# Patient Record
Sex: Male | Born: 1975
Health system: Southern US, Community
[De-identification: ages and names within clinical notes are randomized; demographics above are authoritative.]

## PROBLEM LIST (undated history)

## (undated) DIAGNOSIS — Q909 Down syndrome, unspecified: Secondary | ICD-10-CM

## (undated) DIAGNOSIS — R7303 Prediabetes: Secondary | ICD-10-CM

## (undated) DIAGNOSIS — K219 Gastro-esophageal reflux disease without esophagitis: Secondary | ICD-10-CM

## (undated) DIAGNOSIS — Z9889 Other specified postprocedural states: Secondary | ICD-10-CM

## (undated) DIAGNOSIS — K760 Fatty (change of) liver, not elsewhere classified: Secondary | ICD-10-CM

## (undated) DIAGNOSIS — M1990 Unspecified osteoarthritis, unspecified site: Secondary | ICD-10-CM

## (undated) HISTORY — PX: TONSILLECTOMY AND ADENOIDECTOMY: SHX28

## (undated) HISTORY — DX: Fatty (change of) liver, not elsewhere classified: K76.0

## (undated) HISTORY — PX: HERNIA REPAIR: SHX51

## (undated) HISTORY — PX: CARDIAC SURGERY: SHX584

---
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
## 2009-11-28 ENCOUNTER — Encounter: Admission: RE | Admit: 2009-11-28 | Discharge: 2009-12-02 | Payer: Self-pay | Admitting: Family Medicine

## 2010-02-11 ENCOUNTER — Emergency Department (HOSPITAL_COMMUNITY): Admission: EM | Admit: 2010-02-11 | Discharge: 2010-02-11 | Payer: Self-pay | Admitting: Emergency Medicine

## 2010-03-06 ENCOUNTER — Ambulatory Visit (HOSPITAL_COMMUNITY): Admission: RE | Admit: 2010-03-06 | Discharge: 2010-03-07 | Payer: Self-pay | Admitting: Surgery

## 2010-03-28 HISTORY — PX: LAPAROSCOPIC ASSISTED VENTRAL HERNIA REPAIR: SHX6312

## 2012-03-27 ENCOUNTER — Emergency Department (HOSPITAL_COMMUNITY): Payer: Medicare Other

## 2012-03-27 ENCOUNTER — Emergency Department (HOSPITAL_COMMUNITY)
Admission: EM | Admit: 2012-03-27 | Discharge: 2012-03-27 | Disposition: A | Discharge status: Home / Self Care | Payer: Medicare Other | Attending: Emergency Medicine | Admitting: Emergency Medicine

## 2012-03-27 ENCOUNTER — Encounter (HOSPITAL_COMMUNITY): Payer: Self-pay | Admitting: Emergency Medicine

## 2012-03-27 DIAGNOSIS — R109 Unspecified abdominal pain: Secondary | ICD-10-CM | POA: Insufficient documentation

## 2012-03-27 DIAGNOSIS — Q909 Down syndrome, unspecified: Secondary | ICD-10-CM | POA: Insufficient documentation

## 2012-03-27 HISTORY — DX: Down syndrome, unspecified: Q90.9

## 2012-03-27 LAB — CBC WITH DIFFERENTIAL/PLATELET
Eosinophils Absolute: 0.1 10*3/uL (ref 0.0–0.7)
Eosinophils Relative: 1 % (ref 0–5)
HCT: 49 % (ref 39.0–52.0)
Hemoglobin: 16.9 g/dL (ref 13.0–17.0)
Lymphocytes Relative: 31 % (ref 12–46)
Lymphs Abs: 2.2 10*3/uL (ref 0.7–4.0)
MCH: 31.8 pg (ref 26.0–34.0)
MCV: 92.3 fL (ref 78.0–100.0)
Monocytes Absolute: 0.5 10*3/uL (ref 0.1–1.0)
Monocytes Relative: 7 % (ref 3–12)
RBC: 5.31 MIL/uL (ref 4.22–5.81)

## 2012-03-27 LAB — URINALYSIS, ROUTINE W REFLEX MICROSCOPIC
Bilirubin Urine: NEGATIVE
Ketones, ur: NEGATIVE mg/dL
Leukocytes, UA: NEGATIVE
Nitrite: NEGATIVE
Specific Gravity, Urine: 1.026 (ref 1.005–1.030)
Urobilinogen, UA: 0.2 mg/dL (ref 0.0–1.0)
pH: 5 (ref 5.0–8.0)

## 2012-03-27 LAB — COMPREHENSIVE METABOLIC PANEL
BUN: 14 mg/dL (ref 6–23)
CO2: 26 mEq/L (ref 19–32)
Calcium: 9.1 mg/dL (ref 8.4–10.5)
GFR calc Af Amer: >90 mL/min (ref >90)
GFR calc non Af Amer: >90 mL/min (ref >90)
Glucose, Bld: 99 mg/dL (ref 70–99)
Total Protein: 7 g/dL (ref 6.0–8.3)

## 2012-03-27 MED ORDER — FENTANYL CITRATE 0.05 MG/ML IJ SOLN
50.0000 ug | Freq: Once | INTRAMUSCULAR | Status: AC
  Administered 2012-03-27: 50 ug via INTRAVENOUS
  Filled 2012-03-27: qty 2

## 2012-03-27 MED ORDER — HYDROCODONE-ACETAMINOPHEN 5-325 MG PO TABS: 2.0000 | ORAL_TABLET | ORAL | Status: DC | PRN

## 2012-03-27 MED ORDER — SODIUM CHLORIDE 0.9 % IV BOLUS (SEPSIS)
1000.0000 mL | Freq: Once | INTRAVENOUS | Status: AC
  Administered 2012-03-27: 1000 mL via INTRAVENOUS

## 2012-03-27 NOTE — ED Notes (Signed)
Patient transported to CT 

## 2012-03-27 NOTE — ED Provider Notes (Signed)
Medical screening examination/treatment/procedure(s) were performed by non-physician practitioner and as supervising physician I was immediately available for consultation/collaboration.   Shelda Jakes, MD 03/27/12 1359

## 2012-03-27 NOTE — ED Notes (Signed)
Pt woke last p.m. C/o right flank pain, denies nausea, difficulty voiding or blood in urine. Pt is restless, unable to sit or stand or get comfortable in any way.

## 2012-03-27 NOTE — ED Provider Notes (Signed)
History     CSN: [id number]  Arrival date & time 03/27/12  7829   First MD Initiated Contact with Patient 03/27/12 1010      Chief Complaint  Patient presents with  . Flank Pain    (Consider location/radiation/quality/duration/timing/severity/associated sxs/prior treatment) HPI Comments: Pt c/o right flank pain:denies history of kidney stone:no known injury  Patient is a 36 y.o. male presenting with flank pain. The history is provided by the patient and a parent. No language interpreter was used.  Flank Pain This is a new problem. The current episode started yesterday. The problem occurs intermittently. The problem has been unchanged. Pertinent negatives include no abdominal pain, coughing, fever, nausea or vomiting. Exacerbated by: pt states that he can't get comfortable in any position. He has tried NSAIDs for the symptoms. The treatment provided mild relief.    Past Medical History  Diagnosis Date  . Down's syndrome     Past Surgical History  Procedure Date  . Hernia repair   . Cardiac surgery     av canal repair, mitral valve replacemt    No family history on file.  History  Substance Use Topics  . Smoking status: Never Smoker   . Smokeless tobacco: Never Used  . Alcohol Use: No      Review of Systems  Constitutional: Negative for fever.  Respiratory: Negative for cough.   Cardiovascular: Negative.   Gastrointestinal: Negative for nausea, vomiting and abdominal pain.  Genitourinary: Positive for flank pain.  Skin: Negative.     Allergies  Review of patient's allergies indicates no known allergies.  Home Medications   Current Outpatient Rx  Name  Route  Sig  Dispense  Refill  . ALLOPURINOL 100 MG PO TABS   Oral   Take 100 mg by mouth daily.         . IBUPROFEN 200 MG PO TABS   Oral   Take 400 mg by mouth every 6 (six) hours as needed. For pain           BP 124/71  Pulse 77  Temp 97.3 F (36.3 C) (Oral)  Resp 14  SpO2 100%  Physical  Exam  Nursing note and vitals reviewed. Constitutional: He is oriented to person, place, and time. He appears well-developed and well-nourished.  HENT:       Down facies  Neck: Neck supple.  Cardiovascular: Normal rate and regular rhythm.   Pulmonary/Chest: Effort normal and breath sounds normal.  Abdominal: Soft. Bowel sounds are normal. There is no tenderness.  Musculoskeletal: Normal range of motion.       Flank non tender to palpation  Neurological: He is alert and oriented to person, place, and time.  Skin: Skin is warm and dry.  Psychiatric: He has a normal mood and affect.    ED Course  Procedures (including critical care time)   Labs Reviewed  CBC WITH DIFFERENTIAL  COMPREHENSIVE METABOLIC PANEL  URINALYSIS, ROUTINE W REFLEX MICROSCOPIC   Ct Abdomen Pelvis Wo Contrast  03/27/2012  *RADIOLOGY REPORT*  Clinical Data: New onset of right flank pain.  CT ABDOMEN AND PELVIS WITHOUT CONTRAST  Technique:  Multidetector CT imaging of the abdomen and pelvis was performed following the standard protocol without intravenous contrast.  Comparison: CT of the abdomen pelvis 02/11/2010.  Findings:  Lung Bases: Minimal scattered subsegmental atelectasis throughout the visualized lung bases bilaterally.  High attenuation material in the region of the interventricular and interatrial septae immediately inferior to the aortic root is likely postoperative.  Abdomen/Pelvis:  There are no calcifications within the collecting system of either kidney, along the course of either ureter, or within the lumen of the urinary bladder to suggest urinary tract calculi at this time.  No hydroureteronephrosis or perinephric stranding to suggest urinary tract obstruction.  Mild diffuse decreased attenuation throughout the hepatic parenchyma, compatible with hepatic steatosis.  No focal hepatic lesions are identified.  Enhanced appearance of the gallbladder, spleen and bilateral adrenal glands is unremarkable. There  are numerous appendicoliths within the appendix.  However, the appendix is normal in caliber and appearance.  No ascites or pneumoperitoneum and no pathologic distension of small bowel.  No definite pathologic lymphadenopathy identified within the abdomen or pelvis.  Small ventral hernia containing only some omental fat. There is some mild stranding in the omental fat within the hernia, which could suggest some edema or infarction of the fat.  No entrapment of the bowel at this time.  Prostate urinary bladder are unremarkable in appearance.  Musculoskeletal: There are no aggressive appearing lytic or blastic lesions noted in the visualized portions of the skeleton. Sternotomy wires.  IMPRESSION: 1.  Small ventral hernias containing a small amount of omental fat. There is some mild stranding in the fat within the hernia, which could suggest some edema or infarction of the fat.  No evidence of bowel entrapment at this time. 2.  Numerous appendicoliths.  Despite the presence of appendicoliths, there are no findings to suggest acute appendicitis at this time. 3.  No abnormal urinary tract calculi or findings of urinary tract obstruction. 4.  Hepatic steatosis. 5.  Additional incidental findings, as above.   Original Report Authenticated By: Trudie Reed, M.D.      1. Flank pain       MDM  Discussed findings with father:pt is comfortable at this time:will send home with something for pain:discussed with father the possibility of shingles         Teressa Lower, NP 03/27/12 1353

## 2012-03-27 NOTE — ED Notes (Signed)
Returned from CT.

## 2012-03-27 NOTE — ED Notes (Addendum)
Reports and points to right upper lateral rib area described as a cramp rated 8/19. Onset @ 1200am regardless of position, denies dysuria, no hematuria, no nausea, no vomiting.

## 2012-07-12 ENCOUNTER — Encounter: Payer: Self-pay | Admitting: Internal Medicine

## 2012-07-25 ENCOUNTER — Encounter: Payer: Self-pay | Admitting: Internal Medicine

## 2012-07-28 ENCOUNTER — Encounter: Payer: Self-pay | Admitting: Internal Medicine

## 2012-07-28 ENCOUNTER — Ambulatory Visit (INDEPENDENT_AMBULATORY_CARE_PROVIDER_SITE_OTHER): Payer: Medicare Other | Admitting: Internal Medicine

## 2012-07-28 VITALS — BP 90/70 | HR 64 | Ht 60.0 in | Wt 177.8 lb

## 2012-07-28 DIAGNOSIS — R1011 Right upper quadrant pain: Secondary | ICD-10-CM

## 2012-07-28 DIAGNOSIS — M109 Gout, unspecified: Secondary | ICD-10-CM | POA: Insufficient documentation

## 2012-07-28 DIAGNOSIS — Q909 Down syndrome, unspecified: Secondary | ICD-10-CM | POA: Insufficient documentation

## 2012-07-28 NOTE — Patient Instructions (Addendum)
You have been schedule for a HIDA scan at [facility] on 08/05/2012 @ 12:30pm.  Please arrive 15 minutes prior to your appointment.  Have nothing to eat or drink 6 hours prior to your appointment.  If you need to cancel or reschedule please call [phone]

## 2012-07-28 NOTE — Progress Notes (Signed)
Patient ID: Thomas Salinas, male   DOB: [birth year], 37 y.o.   MRN: [id number]  SUBJECTIVE: HPI Thomas Salinas, Thomas Salinas, is a 37 year old male with Down syndrome who is seen in consultation at the request of Dr. Kathrynn Running for evaluation of right flank pain. Patient is accompanied today by his father. The patient and his father described 2 discrete episodes of right sided abdominal pain in the midaxillary line over the last 3-4 months. Both episodes have started somewhat acutely and lasted for 12-36 hours. The patient reports no change in the pain with movement. The patient does not recall the association with eating, but his mother states that food seems to make it worse. It also has started both times in the middle of the night. They deny associated nausea or vomiting. There is some mild anorexia around the time of the pain. No associated with bowel movement no change in bowel habits including no rectal bleeding or melena. Fevers or chills. No urinary symptoms including no hematuria or dysuria. He was seen and evaluated each time and he is now had both an abdominal CT scan and abdominal ultrasound. Lab testing and urinalysis were unrevealing. He is not having any pain today and has not had a recurrent episode in the last 3 weeks. 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  He very very infrequently uses ibuprofen  Review of Systems  As per history of present illness, otherwise negative   Past Medical History  Diagnosis Date  . Down's syndrome   . Fatty liver     Current Outpatient Prescriptions  Medication Sig Dispense Refill  . allopurinol (ZYLOPRIM) 100 MG tablet Take 100 mg by mouth daily.      Marland Kitchen ibuprofen (ADVIL,MOTRIN) 200 MG tablet Take 400 mg by mouth every 6 (six) hours as needed. For pain       No current facility-administered medications for this visit.    No Known Allergies  Family History  Problem Relation Age of Onset  . Heart disease Father     History   Substance Use Topics  . Smoking status: Never Smoker   . Smokeless tobacco: Never Used  . Alcohol Use: No    OBJECTIVE: BP 90/70  Pulse 64  Ht 5' (1.524 m)  Wt 177 lb 12.8 oz (80.65 kg)  BMI 34.72 kg/m2 Constitutional: Pleasant male in no acute distress, classic appearance for trisomy 35 HEENT: Normocephalic and atraumatic. Oropharynx is clear and moist. No oropharyngeal exudate. Conjunctivae are normal. No scleral icterus. Neck: Neck supple. Trachea midline. Cardiovascular: Normal rate, regular rhythm and intact distal pulses. There is no pain over palpation of the rib cage Pulmonary/chest: Effort normal and breath sounds normal. No wheezing, rales or rhonchi. Abdominal: Soft, nontender, nondistended. Bowel sounds active throughout. There are no masses palpable. No hepatosplenomegaly. Extremities: no clubbing, cyanosis, or edema Lymphadenopathy: No cervical adenopathy noted. Neurological: Alert and oriented to person place and time. Skin: Skin is warm and dry. No rashes noted. Psychiatric: Normal mood and affect. Behavior is normal.  Labs and Imaging -- CBC    Component Value Date/Time   WBC 7.1 03/27/2012 0951   RBC 5.31 03/27/2012 0951   HGB 16.9 03/27/2012 0951   HCT 49.0 03/27/2012 0951   PLT 177 03/27/2012 0951   MCV 92.3 03/27/2012 0951   MCH 31.8 03/27/2012 0951   MCHC 34.5 03/27/2012 0951   RDW 13.3 03/27/2012 0951   LYMPHSABS 2.2 03/27/2012 0951   MONOABS 0.5 03/27/2012 0951  EOSABS 0.1 03/27/2012 0951   BASOSABS 0.0 03/27/2012 0951    CMP     Component Value Date/Time   NA 136 03/27/2012 0951   K 4.4 03/27/2012 0951   CL 100 03/27/2012 0951   CO2 26 03/27/2012 0951   GLUCOSE 99 03/27/2012 0951   BUN 14 03/27/2012 0951   CREATININE 0.87 03/27/2012 0951   CALCIUM 9.1 03/27/2012 0951   PROT 7.0 03/27/2012 0951   ALBUMIN 3.9 03/27/2012 0951   AST 32 03/27/2012 0951   ALT 53 03/27/2012 0951   ALKPHOS 45 03/27/2012 0951   BILITOT 0.5 03/27/2012 0951   GFRNONAA >90 03/27/2012  0951   GFRAA >90 03/27/2012 0951    CT ABDOMEN AND PELVIS WITHOUT CONTRAST - 03/27/2012   Technique:  Multidetector CT imaging of the abdomen and pelvis was performed following the standard protocol without intravenous contrast.   Comparison: CT of the abdomen pelvis 02/11/2010.   Findings:   Lung Bases: Minimal scattered subsegmental atelectasis throughout the visualized lung bases bilaterally.  High attenuation material in the region of the interventricular and interatrial septae immediately inferior to the aortic root is likely postoperative.   Abdomen/Pelvis:  There are no calcifications within the collecting system of either kidney, along the course of either ureter, or within the lumen of the urinary bladder to suggest urinary tract calculi at this time.  No hydroureteronephrosis or perinephric stranding to suggest urinary tract obstruction.   Mild diffuse decreased attenuation throughout the hepatic parenchyma, compatible with hepatic steatosis.  No focal hepatic lesions are identified.  Enhanced appearance of the gallbladder, spleen and bilateral adrenal glands is unremarkable. There are numerous appendicoliths within the appendix.  However, the appendix is normal in caliber and appearance.  No ascites or pneumoperitoneum and no pathologic distension of small bowel.  No definite pathologic lymphadenopathy identified within the abdomen or pelvis.  Small ventral hernia containing only some omental fat. There is some mild stranding in the omental fat within the hernia, which could suggest some edema or infarction of the fat.  No entrapment of the bowel at this time.  Prostate urinary bladder are unremarkable in appearance.   Musculoskeletal: There are no aggressive appearing lytic or blastic lesions noted in the visualized portions of the skeleton. Sternotomy wires.   IMPRESSION: 1.  Small ventral hernias containing a small amount of omental fat. There is some mild  stranding in the fat within the hernia, which could suggest some edema or infarction of the fat.  No evidence of bowel entrapment at this time. 2.  Numerous appendicoliths.  Despite the presence of appendicoliths, there are no findings to suggest acute appendicitis at this time. 3.  No abnormal urinary tract calculi or findings of urinary tract obstruction. 4.  Hepatic steatosis. 5.  Additional incidental findings, as above.  Ultrasound-abdominal Limited, 07/05/2012 Impression fatty liver. Partially visualized pancreas is unremarkable. Normal gallbladder. Common bile duct measures 0.3 cm diameter, normal. Hepatopedal blood flow in the portal vein by color duplex. Normal right kidney 10.2 cm in length.  Urinalysis, same day, negative for blood, nitrite or leukocyte esterase WBC 9.0, hemoglobin 16.4, platelet count 207 CMP normal except for mild elevation in BUN at 24 glucose 105  ASSESSMENT AND PLAN:  37 year old male with Down syndrome who is seen in consultation at the request of Dr. Kathrynn Running for evaluation of right flank pain.   1.  Right-sided abd vs flank pain -- the differential includes biliary colic/dyskinesia, right-sided renal stone, musculoskeletal pain, and less likely  PUD.  Given that the attacks are very discrete and occur only for 12-24 hours, but musculoskeletal pain and gastritis/peptic ulcer disease are felt less likely.  Prior imaging both of the gallbladder and right kidney have been unremarkable with no evidence for stones. Also of note there was no blood in his urine.  Certainly he could have biliary dyskinesia, and I have requested a HIDA scan to evaluate for this. He is feeling well now, and I think endoscopy is low yield for providing a diagnosis. He is instructed to call us back should the pain return. Both he and his father state understanding. Should his biliary imaging be abnormal, we would consider referral for cholecystectomy.

## 2012-08-05 ENCOUNTER — Encounter (HOSPITAL_COMMUNITY)
Admission: RE | Admit: 2012-08-05 | Discharge: 2012-08-05 | Disposition: A | Discharge status: Home / Self Care | Payer: Medicare Other | Source: Ambulatory Visit | Attending: Internal Medicine | Admitting: Internal Medicine

## 2012-08-05 DIAGNOSIS — R1011 Right upper quadrant pain: Secondary | ICD-10-CM

## 2012-08-05 MED ORDER — TECHNETIUM TC 99M MEBROFENIN IV KIT
5.3000 | PACK | Freq: Once | INTRAVENOUS | Status: AC | PRN
  Administered 2012-08-05: 5 via INTRAVENOUS

## 2012-09-06 ENCOUNTER — Other Ambulatory Visit (INDEPENDENT_AMBULATORY_CARE_PROVIDER_SITE_OTHER): Payer: Self-pay | Admitting: General Surgery

## 2012-09-06 ENCOUNTER — Ambulatory Visit (INDEPENDENT_AMBULATORY_CARE_PROVIDER_SITE_OTHER): Payer: Medicaid Other | Admitting: General Surgery

## 2012-09-06 ENCOUNTER — Encounter (INDEPENDENT_AMBULATORY_CARE_PROVIDER_SITE_OTHER): Payer: Self-pay | Admitting: General Surgery

## 2012-09-06 VITALS — BP 130/74 | HR 80 | Temp 98.0°F | Resp 18 | Ht 61.0 in | Wt 178.0 lb

## 2012-09-06 DIAGNOSIS — K828 Other specified diseases of gallbladder: Secondary | ICD-10-CM

## 2012-09-06 NOTE — Progress Notes (Signed)
Patient ID: Thomas Salinas, male   DOB: [birth year], 37 y.o.   MRN: [id number]  Chief Complaint  Patient presents with  . Establish Care    G.B    HPI Thomas Salinas is a 37 y.o. male.  The patient is a 37 year old male with Down syndrome who is referred by Dr. Rhea Belton for evaluation of biliary dyskinesia. The patient began having right-sided flank/right upper quadrant pain 4 months ago. This is accompanied with nausea and vomiting after meals. The patient does have a high fatty diet which causes pain.  HIDA scan reveals an ejection fraction of 28. CT scan and ultrasound revealed no stones. HPI  Past Medical History  Diagnosis Date  . Down's syndrome   . Fatty liver     Past Surgical History  Procedure Laterality Date  . Hernia repair    . Cardiac surgery      av canal repair, mitral valve replacemt  . Myringotomy  1989    left ear  . Eye surgery  1981    strabismus    Family History  Problem Relation Age of Onset  . Heart disease Father   . Hypertension Mother     Social History History  Substance Use Topics  . Smoking status: Never Smoker   . Smokeless tobacco: Never Used  . Alcohol Use: No    No Known Allergies  Current Outpatient Prescriptions  Medication Sig Dispense Refill  . allopurinol (ZYLOPRIM) 100 MG tablet Take 100 mg by mouth daily.      Marland Kitchen ibuprofen (ADVIL,MOTRIN) 200 MG tablet Take 400 mg by mouth every 6 (six) hours as needed. For pain       No current facility-administered medications for this visit.    Review of Systems Review of Systems  Constitutional: Negative.   Eyes: Negative.   Respiratory: Negative.   Cardiovascular: Negative.   Gastrointestinal: Negative.   Neurological: Negative.     Blood pressure 130/74, pulse 80, temperature 98 F (36.7 C), resp. rate 18, height 5\' 1"  (1.549 m), weight 178 lb (80.74 kg).  Physical Exam Physical Exam  Constitutional: He is oriented to person, place, and time. He appears well-developed and  well-nourished.  HENT:  Head: Normocephalic and atraumatic.  Eyes: Conjunctivae and EOM are normal. Pupils are equal, round, and reactive to light.  Neck: Neck supple.  Cardiovascular: Normal rate, regular rhythm and normal heart sounds.   Pulmonary/Chest: Effort normal and breath sounds normal.  Abdominal: Soft. Bowel sounds are normal. There is tenderness (ruq).  Musculoskeletal: Normal range of motion.  Neurological: He is alert and oriented to person, place, and time.    Data Reviewed Reviewed as above  Assessment    36 year old male with biliary dyskinesia     Plan    1. We'll proceed to the operating room for a laparoscopic cholecystectomy without any cholangiogram.  2.All risks and benefits were discussed with the patient to generally include: infection, bleeding, possible need for post op ERCP, damage to the bile ducts, and bile leak. Alternatives were offered and described.  All questions were answered and the patient voiced understanding of the procedure and wishes to proceed at this point with a laparoscopic cholecystectomy         Marigene Ehlers., Dorann Davidson 09/06/2012, 10:02 AM

## 2012-09-07 ENCOUNTER — Observation Stay (HOSPITAL_COMMUNITY)
Admission: AD | Admit: 2012-09-07 | Discharge: 2012-09-08 | Disposition: A | Discharge status: Home / Self Care | Payer: Medicare Other | Source: Ambulatory Visit | Attending: General Surgery | Admitting: General Surgery

## 2012-09-07 ENCOUNTER — Telehealth (INDEPENDENT_AMBULATORY_CARE_PROVIDER_SITE_OTHER): Payer: Self-pay | Admitting: General Surgery

## 2012-09-07 DIAGNOSIS — Z862 Personal history of diseases of the blood and blood-forming organs and certain disorders involving the immune mechanism: Secondary | ICD-10-CM | POA: Insufficient documentation

## 2012-09-07 DIAGNOSIS — Z79899 Other long term (current) drug therapy: Secondary | ICD-10-CM | POA: Insufficient documentation

## 2012-09-07 DIAGNOSIS — Q909 Down syndrome, unspecified: Secondary | ICD-10-CM | POA: Insufficient documentation

## 2012-09-07 DIAGNOSIS — R112 Nausea with vomiting, unspecified: Secondary | ICD-10-CM | POA: Insufficient documentation

## 2012-09-07 DIAGNOSIS — R1011 Right upper quadrant pain: Secondary | ICD-10-CM | POA: Insufficient documentation

## 2012-09-07 DIAGNOSIS — Z8639 Personal history of other endocrine, nutritional and metabolic disease: Secondary | ICD-10-CM | POA: Insufficient documentation

## 2012-09-07 DIAGNOSIS — K8020 Calculus of gallbladder without cholecystitis without obstruction: Principal | ICD-10-CM | POA: Insufficient documentation

## 2012-09-07 LAB — COMPREHENSIVE METABOLIC PANEL
CO2: 23 mEq/L (ref 19–32)
Calcium: 7.8 mg/dL — ABNORMAL LOW (ref 8.4–10.5)
Creatinine, Ser: 0.77 mg/dL (ref 0.50–1.35)
GFR calc Af Amer: >90 mL/min (ref >90)
GFR calc non Af Amer: >90 mL/min (ref >90)
Glucose, Bld: 86 mg/dL (ref 70–99)

## 2012-09-07 LAB — CBC
Hemoglobin: 16.4 g/dL (ref 13.0–17.0)
MCHC: 36.4 g/dL — ABNORMAL HIGH (ref 30.0–36.0)
RBC: 5.01 MIL/uL (ref 4.22–5.81)

## 2012-09-07 MED ORDER — CEFAZOLIN SODIUM-DEXTROSE 2-3 GM-% IV SOLR: 2.0000 g | INTRAVENOUS | Status: DC

## 2012-09-07 MED ORDER — HYDROCODONE-ACETAMINOPHEN 5-325 MG PO TABS
1.0000 | ORAL_TABLET | ORAL | Status: DC | PRN
  Administered 2012-09-08: 1 via ORAL
  Filled 2012-09-07: qty 1

## 2012-09-07 MED ORDER — ENOXAPARIN SODIUM 40 MG/0.4ML ~~LOC~~ SOLN
40.0000 mg | SUBCUTANEOUS | Status: DC
  Filled 2012-09-07: qty 0.4

## 2012-09-07 MED ORDER — ACETAMINOPHEN 650 MG RE SUPP: 650.0000 mg | Freq: Four times a day (QID) | RECTAL | Status: DC | PRN

## 2012-09-07 MED ORDER — ONDANSETRON HCL 4 MG/2ML IJ SOLN: 4.0000 mg | Freq: Four times a day (QID) | INTRAMUSCULAR | Status: DC | PRN

## 2012-09-07 MED ORDER — KCL IN DEXTROSE-NACL 20-5-0.45 MEQ/L-%-% IV SOLN
INTRAVENOUS | Status: DC
  Administered 2012-09-07: via INTRAVENOUS
  Filled 2012-09-07 (×2): qty 1000

## 2012-09-07 MED ORDER — ACETAMINOPHEN 325 MG PO TABS: 650.0000 mg | ORAL_TABLET | Freq: Four times a day (QID) | ORAL | Status: DC | PRN

## 2012-09-07 MED ORDER — DOCUSATE SODIUM 100 MG PO CAPS
100.0000 mg | ORAL_CAPSULE | Freq: Two times a day (BID) | ORAL | Status: DC
  Administered 2012-09-08: 100 mg via ORAL
  Filled 2012-09-07 (×3): qty 1

## 2012-09-07 MED ORDER — MORPHINE SULFATE 2 MG/ML IJ SOLN: 2.0000 mg | INTRAMUSCULAR | Status: DC | PRN

## 2012-09-07 MED ORDER — CHLORHEXIDINE GLUCONATE 4 % EX LIQD
1.0000 "application " | Freq: Once | CUTANEOUS | Status: DC
  Filled 2012-09-07: qty 15

## 2012-09-07 NOTE — H&P (Signed)
No chief complaint on file.   HISTORY:  Thomas Salinas is a 37 y.o. male with Down Syndrome who presents to the hospital with worsening RUQ pain.  He was recently seen by Dr Derrell Lolling for biliary colic.  He was scheduled for surgery in early May, but is unable to tolerate any PO without significant pain.   The patient began having right-sided flank/right upper quadrant pain 4 months ago. This is accompanied with nausea and vomiting after meals. The patient eats a high fat diet which causes pain. HIDA scan reveals an ejection fraction of 28%. CT scan and ultrasound revealed no stones.   Past Medical History  Diagnosis Date  . Down's syndrome   . Fatty liver        Past Surgical History  Procedure Laterality Date  . Hernia repair    . Cardiac surgery      av canal repair, mitral valve replacemt  . Myringotomy  1989    left ear  . Eye surgery  1981    strabismus      Current Facility-Administered Medications  Medication Dose Route Frequency Provider Last Rate Last Dose  . acetaminophen (TYLENOL) tablet 650 mg  650 mg Oral Q6H PRN Romie Levee, MD       Or  . acetaminophen (TYLENOL) suppository 650 mg  650 mg Rectal Q6H PRN Romie Levee, MD      . dextrose 5 % and 0.45 % NaCl with KCl 20 mEq/L infusion   Intravenous Continuous Romie Levee, MD      . docusate sodium (COLACE) capsule 100 mg  100 mg Oral BID Romie Levee, MD      . Melene Muller ON 09/08/2012] enoxaparin (LOVENOX) injection 40 mg  40 mg Subcutaneous Q24H Romie Levee, MD      . HYDROcodone-acetaminophen (NORCO/VICODIN) 5-325 MG per tablet 1-2 tablet  1-2 tablet Oral Q4H PRN Romie Levee, MD      . morphine 2 MG/ML injection 2 mg  2 mg Intravenous Q3H PRN Romie Levee, MD      . ondansetron [facility]) injection 4 mg  4 mg Intravenous Q6H PRN Romie Levee, MD         No Known Allergies    Family History  Problem Relation Age of Onset  . Heart disease Father   . Hypertension Mother       History   Social History  .  Marital Status: Single    Spouse Name: N/A    Number of Children: N/A  . Years of Education: N/A   Social History Main Topics  . Smoking status: Never Smoker   . Smokeless tobacco: Never Used  . Alcohol Use: No  . Drug Use: No  . Sexually Active: Not on file   Other Topics Concern  . Not on file   Social History Narrative  . No narrative on file       REVIEW OF SYSTEMS - PERTINENT POSITIVES ONLY: Review of Systems - General ROS: negative for - chills or fever Respiratory ROS: no cough, shortness of breath, or wheezing Cardiovascular ROS: no chest pain or dyspnea on exertion Gastrointestinal ROS: positive for - abdominal pain, diarrhea and nausea/vomiting negative for - blood in stools or melena  EXAM:  General appearance: alert and cooperative Resp: clear to auscultation bilaterally Cardio: regular rate and rhythm GI: soft, nondistended, tenderness to palpation in RUQ   LABORATORY RESULTS: Available labs are reviewed  Lab Results  Component Value Date   ALT 53 03/27/2012  AST 32 03/27/2012   ALKPHOS 45 03/27/2012   BILITOT 0.5 03/27/2012   Repeat LFTs pending  RADIOLOGY RESULTS:   Images and reports are reviewed. [place]: normal gallbladder, CBD 3mm, no stones HIDA: Gallbladder ejection fraction after ingestion of Ensure was 28.8%  ASSESSMENT AND PLAN: Thomas Salinas is a 37 y.o. M with Down Syndrome that has biliary colic.  He is unable to tolerate a diet at home and his parents feel that he will not be able to continue until his scheduled apt for cholecystectomy in May.  I will admit him to the [facility],MD service and make him NPO.  I will get some labs tonight and discuss possible cholecystectomy tomorrow with Dr Daphine Deutscher in the AM.    Vanita Panda, MD Colon and Rectal Surgery / General Surgery [facility]

## 2012-09-07 NOTE — Telephone Encounter (Signed)
pts father called stating increasing abd pain and inability to tolerate po.  Scheduled for surgery in early may but they do not think he will be able to wait until then.  i have arranged for a direct admit to [facility].  We will draw labs and possibly do the surgery tom.

## 2012-09-08 ENCOUNTER — Encounter (HOSPITAL_COMMUNITY): Payer: Self-pay | Admitting: General Practice

## 2012-09-08 DIAGNOSIS — R112 Nausea with vomiting, unspecified: Secondary | ICD-10-CM

## 2012-09-08 LAB — SURGICAL PCR SCREEN
MRSA, PCR: NEGATIVE
Staphylococcus aureus: POSITIVE — AB

## 2012-09-08 MED ORDER — HYDROCODONE-ACETAMINOPHEN 5-325 MG PO TABS: 1.0000 | ORAL_TABLET | ORAL | Status: DC | PRN

## 2012-09-08 NOTE — Discharge Summary (Signed)
Physician Discharge Summary  Patient ID: Thomas Salinas MRN: [id number] DOB/AGE: 35-11-1975 35 y.o.  Admit date: 09/07/2012 Discharge date: 09/08/2012  Admitting Diagnosis: Nausea, Vomiting, Diarrhea RUQ abdominal pain  Discharge Diagnosis Patient Active Problem List   Diagnosis Date Noted  . Down syndrome 07/28/2012  . Gout 07/28/2012    Consultants None  Imaging: No results found.  Procedures None  Hospital Course:  37 y.o. male with Down Syndrome who presents to the hospital with worsening RUQ pain, nausea, vomiting, and diarrhea. He was recently seen by Dr Derrell Lolling for biliary dyskinesia. He was scheduled for surgery in early May, but is unable to tolerate any PO without significant pain.  The patient began having right-sided flank/right upper quadrant pain 4 months ago, worse over last 4 days. This is accompanied with nausea and vomiting after meals. The patient eats a high fat diet which causes pain. HIDA scan reveals an ejection fraction of 28%. CT scan and ultrasound revealed no stones.  Patient was admitted for pain control and to monitor his nausea, vomiting, and diarrhea.  It was thought he likely has gastroenteritis as well as his gallbladder problems.  On HD #2 his symptoms started to resolve and he was started on a diet.  This  was advanced as tolerated.  On HD #2, the patient was voiding well, tolerating diet, ambulating well, pain well controlled, vital signs stable, and felt stable for discharge home.  Dr. Derrell Lolling and the [facility] office will be in contact with the family to arrange OP surgery on Monday or Tuesday.        Medication List    TAKE these medications       HYDROcodone-acetaminophen 5-325 MG per tablet  Commonly known as:  NORCO/VICODIN  Take 1-2 tablets by mouth every 4 (four) hours as needed.     naproxen sodium 220 MG tablet  Commonly known as:  ANAPROX  Take 220 mg by mouth 2 (two) times daily as needed (for pain).             Follow-up  Information   Call Lajean Saver, MD.   Contact information:   [address] [place] [address] [phone]       Signed: [facility] [phone]  09/08/2012, 9:50 AM

## 2012-09-08 NOTE — Progress Notes (Signed)
  Subjective: Pt feeling about the same, subjectively pain is about the same, but not much pain on exam.  Pt no longer nauseated and vomiting.  Pt still NPO.  Pt ambulating to the bathroom.  Father at bedside would really like his son to have the surgery while he's here.    Objective: Vital signs in last 24 hours: Temp:  [97.6 F (36.4 C)-98.1 F (36.7 C)] 98 F (36.7 C) (04/17 0551) Pulse Rate:  [73-89] 73 (04/17 0551) Resp:  [16-18] 18 (04/17 0551) BP: (99-105)/(63-67) 100/67 mmHg (04/17 0551) SpO2:  [96 %] 96 % (04/17 0551) Weight:  [178 lb (80.74 kg)] 178 lb (80.74 kg) (04/17 0100)    Intake/Output from previous day: 04/16 0701 - 04/17 0700 In: 487.5 [I.V.:487.5] Out: -  Intake/Output this shift:    PE: Gen:  Alert, NAD, pleasant Abd: Soft, minimal tenderness, ND, +BS, no HSM   Lab Results:   Recent Labs  09/07/12 2240  WBC 4.8  HGB 16.4  HCT 45.1  PLT 167   BMET  Recent Labs  09/07/12 2240  NA 137  K 3.6  CL 103  CO2 23  GLUCOSE 86  BUN 13  CREATININE 0.77  CALCIUM 7.8*   PT/INR No results found for this basename: LABPROT, INR,  in the last 72 hours CMP     Component Value Date/Time   NA 137 09/07/2012 2240   K 3.6 09/07/2012 2240   CL 103 09/07/2012 2240   CO2 23 09/07/2012 2240   GLUCOSE 86 09/07/2012 2240   BUN 13 09/07/2012 2240   CREATININE 0.77 09/07/2012 2240   CALCIUM 7.8* 09/07/2012 2240   PROT 6.4 09/07/2012 2240   ALBUMIN 3.2* 09/07/2012 2240   AST 55* 09/07/2012 2240   ALT 75* 09/07/2012 2240   ALKPHOS 44 09/07/2012 2240   BILITOT 0.5 09/07/2012 2240   GFRNONAA >90 09/07/2012 2240   GFRAA >90 09/07/2012 2240   Lipase  No results found for this basename: lipase       Studies/Results: No results found.  Anti-infectives: Anti-infectives   Start     Dose/Rate Route Frequency Ordered Stop   09/08/12 0600  ceFAZolin (ANCEF) IVPB 2 g/50 mL premix     2 g 100 mL/hr over 30 Minutes Intravenous On call to O.R. 09/07/12 2258 09/09/12  0559       Assessment/Plan RUQ abdominal pain, biliary colic Now presenting primarily due to nausea/vomiting, abdominal pain very minimal 1.  IVF, antibiotics, pain control 2.  Advance diet to clears then soft diet at lunch 3.  Plan to send him home and set him up for surgery on Monday or Tuesday next week 4.  Probably d/c today  Down syndrome H/o Gout    LOS: 1 day    Aris Georgia 09/08/2012, 7:22 AM Pager: [phone]

## 2012-09-08 NOTE — Care Management Note (Signed)
    Page 1 of 1   09/08/2012     11:53:53 AM   CARE MANAGEMENT NOTE 09/08/2012  Patient:  Thomas Salinas, Thomas Salinas   Account Number:  [id number]  Date Initiated:  09/08/2012  Documentation initiated by:  Lorenda Ishihara  Subjective/Objective Assessment:   37 yo male admitted with abd pain, plan for cholecystectomy. PTA lived at home with parents.     Action/Plan:   Home when stable   Anticipated DC Date:  09/08/2012   Anticipated DC Plan:  HOME/SELF CARE      DC Planning Services  CM consult      Choice offered to / List presented to:             Status of service:  Completed, signed off Medicare Important Message given?   (If response is "NO", the following Medicare IM given date fields will be blank) Date Medicare IM given:   Date Additional Medicare IM given:    Discharge Disposition:  HOME/SELF CARE  Per UR Regulation:  Reviewed for med. necessity/level of care/duration of stay  If discussed at Long Length of Stay Meetings, dates discussed:    Comments:

## 2012-09-12 ENCOUNTER — Encounter (HOSPITAL_COMMUNITY): Payer: Self-pay | Admitting: Certified Registered"

## 2012-09-12 ENCOUNTER — Ambulatory Visit (HOSPITAL_COMMUNITY): Payer: Medicare Other | Admitting: Certified Registered"

## 2012-09-12 ENCOUNTER — Other Ambulatory Visit (INDEPENDENT_AMBULATORY_CARE_PROVIDER_SITE_OTHER): Payer: Self-pay | Admitting: General Surgery

## 2012-09-12 ENCOUNTER — Encounter (HOSPITAL_COMMUNITY)
Admission: RE | Disposition: A | Discharge status: Home / Self Care | Payer: Self-pay | Source: Ambulatory Visit | Attending: General Surgery

## 2012-09-12 ENCOUNTER — Encounter (HOSPITAL_COMMUNITY): Payer: Self-pay | Admitting: *Deleted

## 2012-09-12 ENCOUNTER — Observation Stay (HOSPITAL_COMMUNITY)
Admission: RE | Admit: 2012-09-12 | Discharge: 2012-09-13 | Disposition: A | Discharge status: Home / Self Care | Payer: Medicare Other | Source: Ambulatory Visit | Attending: General Surgery | Admitting: General Surgery

## 2012-09-12 ENCOUNTER — Encounter (INDEPENDENT_AMBULATORY_CARE_PROVIDER_SITE_OTHER): Payer: Self-pay | Admitting: General Surgery

## 2012-09-12 DIAGNOSIS — K811 Chronic cholecystitis: Principal | ICD-10-CM | POA: Insufficient documentation

## 2012-09-12 DIAGNOSIS — K7689 Other specified diseases of liver: Secondary | ICD-10-CM | POA: Insufficient documentation

## 2012-09-12 DIAGNOSIS — Z79899 Other long term (current) drug therapy: Secondary | ICD-10-CM | POA: Insufficient documentation

## 2012-09-12 DIAGNOSIS — Z9049 Acquired absence of other specified parts of digestive tract: Secondary | ICD-10-CM

## 2012-09-12 DIAGNOSIS — Q909 Down syndrome, unspecified: Secondary | ICD-10-CM | POA: Insufficient documentation

## 2012-09-12 HISTORY — PX: CHOLECYSTECTOMY: SHX55

## 2012-09-12 SURGERY — LAPAROSCOPIC CHOLECYSTECTOMY
Anesthesia: General | Wound class: Clean

## 2012-09-12 MED ORDER — MUPIROCIN 2 % EX OINT
TOPICAL_OINTMENT | CUTANEOUS | Status: AC
  Administered 2012-09-12: 13:00:00
  Filled 2012-09-12: qty 22

## 2012-09-12 MED ORDER — SUCCINYLCHOLINE CHLORIDE 20 MG/ML IJ SOLN
INTRAMUSCULAR | Status: DC | PRN
  Administered 2012-09-12: 100 mg via INTRAVENOUS

## 2012-09-12 MED ORDER — LACTATED RINGERS IV SOLN: INTRAVENOUS | Status: DC

## 2012-09-12 MED ORDER — BUPIVACAINE-EPINEPHRINE (PF) 0.5% -1:200000 IJ SOLN
INTRAMUSCULAR | Status: AC
  Filled 2012-09-12: qty 10

## 2012-09-12 MED ORDER — LIDOCAINE HCL (PF) 2 % IJ SOLN
INTRAMUSCULAR | Status: DC | PRN
  Administered 2012-09-12: 20 mg

## 2012-09-12 MED ORDER — HYDROMORPHONE HCL PF 1 MG/ML IJ SOLN: 0.5000 mg | INTRAMUSCULAR | Status: DC | PRN

## 2012-09-12 MED ORDER — CEFAZOLIN SODIUM-DEXTROSE 2-3 GM-% IV SOLR
2.0000 g | INTRAVENOUS | Status: AC
  Administered 2012-09-12: 2 g via INTRAVENOUS

## 2012-09-12 MED ORDER — MIDAZOLAM HCL 5 MG/5ML IJ SOLN
INTRAMUSCULAR | Status: DC | PRN
  Administered 2012-09-12: 2 mg via INTRAVENOUS

## 2012-09-12 MED ORDER — ONDANSETRON HCL 4 MG/2ML IJ SOLN: 4.0000 mg | Freq: Four times a day (QID) | INTRAMUSCULAR | Status: DC | PRN

## 2012-09-12 MED ORDER — LACTATED RINGERS IV SOLN
INTRAVENOUS | Status: DC | PRN
  Administered 2012-09-12: 14:00:00 via INTRAVENOUS

## 2012-09-12 MED ORDER — FENTANYL CITRATE 0.05 MG/ML IJ SOLN: 25.0000 ug | INTRAMUSCULAR | Status: DC | PRN

## 2012-09-12 MED ORDER — EPHEDRINE SULFATE 50 MG/ML IJ SOLN
INTRAMUSCULAR | Status: DC | PRN
  Administered 2012-09-12 (×2): 10 mg via INTRAVENOUS

## 2012-09-12 MED ORDER — OXYCODONE-ACETAMINOPHEN 5-325 MG PO TABS
1.0000 | ORAL_TABLET | ORAL | Status: DC | PRN
  Administered 2012-09-12 (×2): 1 via ORAL
  Administered 2012-09-13 (×2): 2 via ORAL
  Filled 2012-09-12: qty 2
  Filled 2012-09-12: qty 1
  Filled 2012-09-12: qty 2
  Filled 2012-09-12: qty 1

## 2012-09-12 MED ORDER — BUPIVACAINE-EPINEPHRINE 0.5% -1:200000 IJ SOLN
INTRAMUSCULAR | Status: DC | PRN
  Administered 2012-09-12: 10 mL

## 2012-09-12 MED ORDER — ALLOPURINOL 100 MG PO TABS
100.0000 mg | ORAL_TABLET | Freq: Every day | ORAL | Status: DC
  Administered 2012-09-12 – 2012-09-13 (×2): 100 mg via ORAL
  Filled 2012-09-12 (×2): qty 1

## 2012-09-12 MED ORDER — LACTATED RINGERS IR SOLN
Status: DC | PRN
  Administered 2012-09-12: 1000 mL

## 2012-09-12 MED ORDER — ONDANSETRON HCL 4 MG PO TABS: 4.0000 mg | ORAL_TABLET | Freq: Four times a day (QID) | ORAL | Status: DC | PRN

## 2012-09-12 MED ORDER — PROPOFOL 10 MG/ML IV BOLUS
INTRAVENOUS | Status: DC | PRN
  Administered 2012-09-12: 150 mg via INTRAVENOUS

## 2012-09-12 MED ORDER — ONDANSETRON HCL 4 MG/2ML IJ SOLN
INTRAMUSCULAR | Status: DC | PRN
  Administered 2012-09-12: 4 mg via INTRAVENOUS

## 2012-09-12 MED ORDER — CEFAZOLIN SODIUM-DEXTROSE 2-3 GM-% IV SOLR
INTRAVENOUS | Status: AC
  Filled 2012-09-12: qty 50

## 2012-09-12 MED ORDER — SODIUM CHLORIDE 0.9 % IV SOLN
INTRAVENOUS | Status: DC
  Administered 2012-09-12: 16:00:00 via INTRAVENOUS

## 2012-09-12 MED ORDER — DEXAMETHASONE SODIUM PHOSPHATE 10 MG/ML IJ SOLN
INTRAMUSCULAR | Status: DC | PRN
  Administered 2012-09-12: 10 mg via INTRAVENOUS

## 2012-09-12 MED ORDER — ROCURONIUM BROMIDE 100 MG/10ML IV SOLN
INTRAVENOUS | Status: DC | PRN
  Administered 2012-09-12: 10 mg via INTRAVENOUS
  Administered 2012-09-12: 30 mg via INTRAVENOUS

## 2012-09-12 MED ORDER — NEOSTIGMINE METHYLSULFATE 1 MG/ML IJ SOLN
INTRAMUSCULAR | Status: DC | PRN
  Administered 2012-09-12: 3 mg via INTRAVENOUS

## 2012-09-12 MED ORDER — FENTANYL CITRATE 0.05 MG/ML IJ SOLN
INTRAMUSCULAR | Status: DC | PRN
  Administered 2012-09-12: 50 ug via INTRAVENOUS
  Administered 2012-09-12: 100 ug via INTRAVENOUS
  Administered 2012-09-12 (×2): 50 ug via INTRAVENOUS

## 2012-09-12 MED ORDER — GLYCOPYRROLATE 0.2 MG/ML IJ SOLN
INTRAMUSCULAR | Status: DC | PRN
  Administered 2012-09-12: 0.4 mg via INTRAVENOUS

## 2012-09-12 SURGICAL SUPPLY — 36 items
APPLIER CLIP 5 13 M/L LIGAMAX5 (MISCELLANEOUS) ×2
BENZOIN TINCTURE PRP APPL 2/3 (GAUZE/BANDAGES/DRESSINGS) ×2 IMPLANT
CABLE HIGH FREQUENCY MONO STRZ (ELECTRODE) IMPLANT
CANISTER SUCTION 2500CC (MISCELLANEOUS) IMPLANT
CHLORAPREP W/TINT 26ML (MISCELLANEOUS) ×2 IMPLANT
CLIP APPLIE 5 13 M/L LIGAMAX5 (MISCELLANEOUS) ×1 IMPLANT
CLOTH BEACON ORANGE TIMEOUT ST (SAFETY) ×2 IMPLANT
COVER MAYO STAND STRL (DRAPES) IMPLANT
DECANTER SPIKE VIAL GLASS SM (MISCELLANEOUS) IMPLANT
DEVICE TROCAR PUNCTURE CLOSURE (ENDOMECHANICALS) ×2 IMPLANT
DRAPE C-ARM 42X72 X-RAY (DRAPES) IMPLANT
DRAPE LAPAROSCOPIC ABDOMINAL (DRAPES) ×2 IMPLANT
ELECT REM PT RETURN 9FT ADLT (ELECTROSURGICAL) ×2
ELECTRODE REM PT RTRN 9FT ADLT (ELECTROSURGICAL) ×1 IMPLANT
GAUZE SPONGE 2X2 8PLY STRL LF (GAUZE/BANDAGES/DRESSINGS) ×1 IMPLANT
GLOVE BIO SURGEON STRL SZ7.5 (GLOVE) ×22 IMPLANT
GOWN STRL NON-REIN LRG LVL3 (GOWN DISPOSABLE) ×4 IMPLANT
GOWN STRL REIN XL XLG (GOWN DISPOSABLE) ×4 IMPLANT
HEMOSTAT SURGICEL 4X8 (HEMOSTASIS) IMPLANT
KIT BASIN OR (CUSTOM PROCEDURE TRAY) ×2 IMPLANT
NEEDLE INSUFFLATION 14GA 120MM (NEEDLE) ×2 IMPLANT
NS IRRIG 1000ML POUR BTL (IV SOLUTION) ×2 IMPLANT
POUCH SPECIMEN RETRIEVAL 10MM (ENDOMECHANICALS) IMPLANT
SCISSORS LAP 5X35 DISP (ENDOMECHANICALS) ×2 IMPLANT
SET CHOLANGIOGRAPH MIX (MISCELLANEOUS) IMPLANT
SET IRRIG TUBING LAPAROSCOPIC (IRRIGATION / IRRIGATOR) ×2 IMPLANT
SOLUTION ANTI FOG 6CC (MISCELLANEOUS) ×2 IMPLANT
SPONGE GAUZE 2X2 STER 10/PKG (GAUZE/BANDAGES/DRESSINGS) ×1
SPONGE GAUZE 4X4 12PLY (GAUZE/BANDAGES/DRESSINGS) IMPLANT
STRIP CLOSURE SKIN 1/2X4 (GAUZE/BANDAGES/DRESSINGS) ×2 IMPLANT
SUT MNCRL AB 4-0 PS2 18 (SUTURE) ×2 IMPLANT
TOWEL OR 17X26 10 PK STRL BLUE (TOWEL DISPOSABLE) ×2 IMPLANT
TRAY LAP CHOLE (CUSTOM PROCEDURE TRAY) ×2 IMPLANT
TROCAR BLADELESS OPT 5 75 (ENDOMECHANICALS) ×6 IMPLANT
TROCAR XCEL NON-BLD 11X100MML (ENDOMECHANICALS) ×2 IMPLANT
TUBING INSUFFLATION 10FT LAP (TUBING) ×2 IMPLANT

## 2012-09-12 NOTE — Anesthesia Preprocedure Evaluation (Addendum)
Anesthesia Evaluation  Patient identified by MRN, date of birth, ID band Patient awake    Reviewed: Allergy & Precautions, H&P , NPO status , Patient's Chart, lab work & pertinent test results  Airway Mallampati: III TM Distance: >3 FB Neck ROM: full    Dental no notable dental hx. (+) Dental Advisory Given, Poor Dentition, Chipped and Missing All teeth broken off/ worn down:   Pulmonary neg pulmonary ROS,  breath sounds clear to auscultation  Pulmonary exam normal       Cardiovascular Exercise Tolerance: Good negative cardio ROS  Rhythm:regular Rate:Normal     Neuro/Psych Down's syndrome negative neurological ROS  negative psych ROS   GI/Hepatic negative GI ROS, Neg liver ROS,   Endo/Other  negative endocrine ROS  Renal/GU negative Renal ROS  negative genitourinary   Musculoskeletal   Abdominal   Peds  Hematology negative hematology ROS (+)   Anesthesia Other Findings   Reproductive/Obstetrics negative OB ROS                          Anesthesia Physical Anesthesia Plan  ASA: II  Anesthesia Plan: General   Post-op Pain Management:    Induction: Intravenous  Airway Management Planned: Oral ETT  Additional Equipment:   Intra-op Plan:   Post-operative Plan: Extubation in OR  Informed Consent: I have reviewed the patients History and Physical, chart, labs and discussed the procedure including the risks, benefits and alternatives for the proposed anesthesia with the patient or authorized representative who has indicated his/her understanding and acceptance.   Dental Advisory Given  Plan Discussed with: CRNA and Surgeon  Anesthesia Plan Comments:        Anesthesia Quick Evaluation

## 2012-09-12 NOTE — Preoperative (Signed)
Beta Blockers   Reason not to administer Beta Blockers:Not Applicable 

## 2012-09-12 NOTE — Progress Notes (Signed)
Pt c/o pain on left eyelid.  Vision check completed- bil pupils 2mm, round and reactive.  No swelling noted to left eyelid.  Or called to request anesthesia to visit pt.  Pt's mother states pt had conjunctivitis several months ago and occasionally c/o left eye hurting.  Pt continues to state it's not his left eye but left eyelid. Pt sat on side of bed and vomited 50cc of bile green stomach contents.  After vomiting, dsg to umbilicus noted to be saturated w/ bright red blood.  I removed dsg and observed blood oozing from umbilical surgical site.  Applied pressure until bleeding stopped and redressed umbilical site.  Other surgical sites remained clean, dry and intact.

## 2012-09-12 NOTE — Interval H&P Note (Signed)
History and Physical Interval Note:  09/12/2012 12:51 PM  Thomas Salinas  has presented today for surgery, with the diagnosis of gallbladder  The various methods of treatment have been discussed with the patient and family. After consideration of risks, benefits and other options for treatment, the patient has consented to  Procedure(s): LAPAROSCOPIC CHOLECYSTECTOMY (N/A) as a surgical intervention .  The patient's history has been reviewed, patient examined, no change in status, stable for surgery.  I have reviewed the patient's chart and labs.  Questions were answered to the patient's satisfaction.     Marigene Ehlers., Jed Limerick

## 2012-09-12 NOTE — Op Note (Signed)
Pre Operative Diagnosis: biliary dyskenesia  Post Operative Diagnosis: same  Surgeon: Dr. Axel Filler   Procedure: lap chole  Assistant: none  Anesthesia: Gen. Endotracheal anesthesia   EBL: 5cc3  Complications:  Counts: reported as correct x 2   Findings: The patient had large amount of omental adhesions to his previous umb hernia repair area.   Indications for procedure: Pt is a 37 y/o M with RUQ pain.  Pt underwent HIDA scan which revealed an EF of 28%.  He was counseled along with his father in clinic and he decided to have his gallbladder electively removed.  Details of the procedure:  The patient was taken to the operating and placed in the supine position with bilateral SCDs in place. A time out was called and all facts were verified. A pneumoperitoneum was obtained via A Veress needle technique to a pressure of 14mm of mercury. A 5mm trochar was then placed in the right upper quadrant under visualization, and there were no injuries to any abdominal organs.  There was a large amount of abdominal wall omental adhesions.  These were taken down bluntly once a 5mm RLQ port was placed.  An area inferior to his umbilicus was cleared away.   A 11 mm port was then placed in the umbilical region after infiltrating with local anesthesia under direct visualization. A  third epigastric port was  placed under direct visualization. The gallbladder was identified and retracted, the peritoneum was then sharply dissected from the gallbladder and this dissection was carried down to Calot's triangle. The gallbladder was identified and stripped away circumferentially and seen going into the gallbladder 360.  The critical angle was obtained.   2 clips were placed distally and one proximally and the cystic duct transected. The cystic artery was identified and 2 clips  Placed distally and one proximally  and transected.  We then proceeded to remove the gallbladder off the hepatic fossa with Bovie  cautery. A latex retrieval bag was then placed in the abdomen and gallbladder placed in the bag. The hepatic fossa was then reexamined and hemostasis was achieved with Bovie cautery and was excellent at the end of the case. The subhepatic fossa and perihepatic fossa was then irrigated until the effluent was clear. The 11 mm trocar fascia was reapproximated with the Endo Close #1 Vicryl x1.  The pneumoperitoneum was evacuated and all trochars removed under direct visulalization.  The skin was then closed with 4-0 Monocryl and the skin dressed with Steri-Strips, gauze, and tape.  The patient was awaken from general anesthesia and taken to the recovery room in stable condition.

## 2012-09-12 NOTE — Anesthesia Postprocedure Evaluation (Signed)
Anesthesia Post Note  Patient: Thomas Salinas  Procedure(s) Performed: Procedure(s) (LRB): LAPAROSCOPIC CHOLECYSTECTOMY (N/A)  Anesthesia type: General  Patient location: PACU  Post pain: Pain level controlled  Post assessment: Post-op Vital signs reviewed  Last Vitals: BP 120/59  Pulse 91  Temp(Src) 36.3 C (Oral)  Resp 18  Ht 5\' 1"  (1.549 m)  Wt 176 lb 6.4 oz (80.015 kg)  BMI 33.35 kg/m2  SpO2 96%  Post vital signs: Reviewed  Level of consciousness: sedated  Complications: No apparent anesthesia complications. Pt was complaining of pain on his left upper eyelid. Pt states on external side. No obvious skin tears or laceration. I explained it is likely irritation from the eye tape used in the OR. Will provide antibiotic ointment.

## 2012-09-12 NOTE — H&P (View-Only) (Signed)
Patient ID: Thomas Salinas, male   DOB: 12/30/1975, 37 y.o.   MRN: [id number]  Chief Complaint  Patient presents with  . Establish Care    G.B    HPI [name] is a 37 y.o. male.  The patient is a 37-year-old male with Down syndrome who is referred by Dr. Pyrtle for evaluation of biliary dyskinesia. The patient began having right-sided flank/right upper quadrant pain 4 months ago. This is accompanied with nausea and vomiting after meals. The patient does have a high fatty diet which causes pain.  HIDA scan reveals an ejection fraction of 28. CT scan and ultrasound revealed no stones. HPI  Past Medical History  Diagnosis Date  . Down's syndrome   . Fatty liver     Past Surgical History  Procedure Laterality Date  . Hernia repair    . Cardiac surgery      av canal repair, mitral valve replacemt  . Myringotomy  1989    left ear  . Eye surgery  1981    strabismus    Family History  Problem Relation Age of Onset  . Heart disease Father   . Hypertension Mother     Social History History  Substance Use Topics  . Smoking status: Never Smoker   . Smokeless tobacco: Never Used  . Alcohol Use: No    No Known Allergies  Current Outpatient Prescriptions  Medication Sig Dispense Refill  . allopurinol (ZYLOPRIM) 100 MG tablet Take 100 mg by mouth daily.      . ibuprofen (ADVIL,MOTRIN) 200 MG tablet Take 400 mg by mouth every 6 (six) hours as needed. For pain       No current facility-administered medications for this visit.    Review of Systems Review of Systems  Constitutional: Negative.   Eyes: Negative.   Respiratory: Negative.   Cardiovascular: Negative.   Gastrointestinal: Negative.   Neurological: Negative.     Blood pressure 130/74, pulse 80, temperature 98 F (36.7 C), resp. rate 18, height 5' 1" (1.549 m), weight 178 lb (80.74 kg).  Physical Exam Physical Exam  Constitutional: He is oriented to person, place, and time. He appears well-developed and  well-nourished.  HENT:  Head: Normocephalic and atraumatic.  Eyes: Conjunctivae and EOM are normal. Pupils are equal, round, and reactive to light.  Neck: Neck supple.  Cardiovascular: Normal rate, regular rhythm and normal heart sounds.   Pulmonary/Chest: Effort normal and breath sounds normal.  Abdominal: Soft. Bowel sounds are normal. There is tenderness (ruq).  Musculoskeletal: Normal range of motion.  Neurological: He is alert and oriented to person, place, and time.    Data Reviewed Reviewed as above  Assessment    37-year-old male with biliary dyskinesia     Plan    1. We'll proceed to the operating room for a laparoscopic cholecystectomy without any cholangiogram.  2.All risks and benefits were discussed with the patient to generally include: infection, bleeding, possible need for post op ERCP, damage to the bile ducts, and bile leak. Alternatives were offered and described.  All questions were answered and the patient voiced understanding of the procedure and wishes to proceed at this point with a laparoscopic cholecystectomy         Anisha Starliper Jr., Endre Coutts 09/06/2012, 10:02 AM    

## 2012-09-12 NOTE — Transfer of Care (Signed)
Immediate Anesthesia Transfer of Care Note  Patient: Thomas Salinas  Procedure(s) Performed: Procedure(s): LAPAROSCOPIC CHOLECYSTECTOMY (N/A)  Patient Location: PACU  Anesthesia Type:General  Level of Consciousness: awake, alert  and oriented  Airway & Oxygen Therapy: Patient Spontanous Breathing and Patient connected to face mask oxygen  Post-op Assessment: Report given to PACU RN and Post -op Vital signs reviewed and stable  Post vital signs: Reviewed and stable  Complications: No apparent anesthesia complications

## 2012-09-13 ENCOUNTER — Encounter (HOSPITAL_COMMUNITY): Payer: Self-pay | Admitting: General Surgery

## 2012-09-13 MED ORDER — OXYCODONE-ACETAMINOPHEN 5-325 MG PO TABS: 1.0000 | ORAL_TABLET | ORAL | Status: DC | PRN

## 2012-09-13 NOTE — Discharge Summary (Signed)
Physician Discharge Summary  Patient ID: Thomas Salinas MRN: [id number] DOB/AGE: November 02, 1975 36 y.o.  Admit date: 09/12/2012 Discharge date: 09/13/2012  Admission Diagnoses:s/p Lap chole  Discharge Diagnoses: s/p lap chole Active Problems:   * No active hospital problems. *   Discharged Condition: good  Hospital Course: Pt had a LAp chole on 4.21, please see full op note for details. Pt was admitted and observed for pain c ontrol.  He had good pain control on PO pain meds, and was tolerating a regular diet.  He was ambulating and afebrile.  HE was deemed stable for DC and DC'd home.  Consults: None  Significant Diagnostic Studies: none  Treatments: surgery: s/p lap chole 4/21  Discharge Exam: Blood pressure 103/64, pulse 96, temperature 97.5 F (36.4 C), temperature source Oral, resp. rate 19, height 5\' 1"  (1.549 m), weight 176 lb 6.4 oz (80.015 kg), SpO2 96.00%. General appearance: alert and cooperative GI: soft, non-tender; bowel sounds normal; no masses,  no organomegaly, wounds c/d/i  Disposition: 01-Home or Self Care  Discharge Orders   Future Orders Complete By Expires     Discharge patient  As directed         Medication List    TAKE these medications       allopurinol 100 MG tablet  Commonly known as:  ZYLOPRIM  Take 100 mg by mouth daily.     HYDROcodone-acetaminophen 5-325 MG per tablet  Commonly known as:  NORCO/VICODIN  Take 1-2 tablets by mouth every 4 (four) hours as needed.     naproxen sodium 220 MG tablet  Commonly known as:  ANAPROX  Take 220 mg by mouth 2 (two) times daily as needed (for pain).     oxyCODONE-acetaminophen 5-325 MG per tablet  Commonly known as:  ROXICET  Take 1 tablet by mouth every 4 (four) hours as needed for pain.           Follow-up Information   Follow up with Lajean Saver, MD. Schedule an appointment as soon as possible for a visit in 2 weeks.   Contact information:   [address] [place]  [address] [phone]       Signed: Marigene Ehlers., Jed Limerick 09/13/2012, 8:56 AM

## 2012-09-13 NOTE — Care Management Note (Signed)
    Page 1 of 1   09/13/2012     10:47:03 AM   CARE MANAGEMENT NOTE 09/13/2012  Patient:  Thomas Salinas, Thomas Salinas   Account Number:  [id number]  Date Initiated:  09/13/2012  Documentation initiated by:  Lorenda Ishihara  Subjective/Objective Assessment:   37 yo male admitted s/p lap chole.     Action/Plan:   Home when stable   Anticipated DC Date:  09/13/2012   Anticipated DC Plan:  HOME/SELF CARE      DC Planning Services  CM consult      Choice offered to / List presented to:             Status of service:  Completed, signed off Medicare Important Message given?   (If response is "NO", the following Medicare IM given date fields will be blank) Date Medicare IM given:   Date Additional Medicare IM given:    Discharge Disposition:  HOME/SELF CARE  Per UR Regulation:  Reviewed for med. necessity/level of care/duration of stay  If discussed at Long Length of Stay Meetings, dates discussed:    Comments:

## 2012-09-28 ENCOUNTER — Encounter (INDEPENDENT_AMBULATORY_CARE_PROVIDER_SITE_OTHER): Payer: Self-pay | Admitting: General Surgery

## 2012-09-28 ENCOUNTER — Telehealth (INDEPENDENT_AMBULATORY_CARE_PROVIDER_SITE_OTHER): Payer: Self-pay

## 2012-09-28 ENCOUNTER — Ambulatory Visit (INDEPENDENT_AMBULATORY_CARE_PROVIDER_SITE_OTHER): Payer: Medicare Other | Admitting: General Surgery

## 2012-09-28 VITALS — BP 126/78 | HR 74 | Temp 97.8°F | Resp 18 | Ht 59.0 in | Wt 177.0 lb

## 2012-09-28 DIAGNOSIS — Z9889 Other specified postprocedural states: Secondary | ICD-10-CM

## 2012-09-28 DIAGNOSIS — Z9049 Acquired absence of other specified parts of digestive tract: Secondary | ICD-10-CM

## 2012-09-28 NOTE — Progress Notes (Signed)
Patient ID: Thomas Salinas, male   DOB: 1975-07-23, 37 y.o.   MRN: [id number] The patient is a 37 year old male status post laparoscopic cholecystectomy. Patient has been doing well postoperatively.  On exam: His wounds are clean dry and intact  Pathology: revealed chronic cholecystitis this was discussed with the patient.  Assessment and plan: The patient is a 37 year old male status post laparoscopic cholecystectomy. 1. Patient follow up when necessary.

## 2012-09-28 NOTE — Telephone Encounter (Addendum)
Called patient to see if he can come in for an earlier appt today (3:00 or 3:30 appt w/Dr. Derrell Lolling)  Will await a return call

## 2019-12-23 ENCOUNTER — Emergency Department (HOSPITAL_COMMUNITY): Payer: Medicare Other

## 2019-12-23 ENCOUNTER — Emergency Department (HOSPITAL_COMMUNITY)
Admission: EM | Admit: 2019-12-23 | Discharge: 2019-12-23 | Disposition: A | Discharge status: Home / Self Care | Payer: Medicare Other | Attending: Emergency Medicine | Admitting: Emergency Medicine

## 2019-12-23 ENCOUNTER — Other Ambulatory Visit: Payer: Self-pay

## 2019-12-23 DIAGNOSIS — Z20822 Contact with and (suspected) exposure to covid-19: Secondary | ICD-10-CM | POA: Diagnosis not present

## 2019-12-23 DIAGNOSIS — D72810 Lymphocytopenia: Secondary | ICD-10-CM | POA: Insufficient documentation

## 2019-12-23 DIAGNOSIS — R109 Unspecified abdominal pain: Secondary | ICD-10-CM | POA: Insufficient documentation

## 2019-12-23 DIAGNOSIS — D696 Thrombocytopenia, unspecified: Secondary | ICD-10-CM | POA: Insufficient documentation

## 2019-12-23 DIAGNOSIS — R52 Pain, unspecified: Secondary | ICD-10-CM

## 2019-12-23 LAB — CBC WITH DIFFERENTIAL/PLATELET
Abs Immature Granulocytes: 0.01 10*3/uL (ref 0.00–0.07)
Basophils Absolute: 0.1 10*3/uL (ref 0.0–0.1)
Basophils Relative: 4 %
Eosinophils Absolute: 0.2 10*3/uL (ref 0.0–0.5)
Eosinophils Relative: 7 %
HCT: 51.6 % (ref 39.0–52.0)
Hemoglobin: 17.6 g/dL — ABNORMAL HIGH (ref 13.0–17.0)
Immature Granulocytes: 0 %
Lymphocytes Relative: 5 %
Lymphs Abs: 0.1 10*3/uL — ABNORMAL LOW (ref 0.7–4.0)
MCH: 33.8 pg (ref 26.0–34.0)
MCHC: 34.1 g/dL (ref 30.0–36.0)
MCV: 99 fL (ref 80.0–100.0)
Monocytes Absolute: 0 10*3/uL — ABNORMAL LOW (ref 0.1–1.0)
Monocytes Relative: 2 %
Neutro Abs: 2.2 10*3/uL (ref 1.7–7.7)
Neutrophils Relative %: 82 %
Platelets: 68 10*3/uL — ABNORMAL LOW (ref 150–400)
RBC: 5.21 MIL/uL (ref 4.22–5.81)
RDW: 13.5 % (ref 11.5–15.5)
WBC: 2.7 10*3/uL — ABNORMAL LOW (ref 4.0–10.5)
nRBC: 19.5 % — ABNORMAL HIGH (ref 0.0–0.2)

## 2019-12-23 LAB — URINALYSIS, ROUTINE W REFLEX MICROSCOPIC
Bilirubin Urine: NEGATIVE
Glucose, UA: NEGATIVE mg/dL
Hgb urine dipstick: NEGATIVE
Ketones, ur: NEGATIVE mg/dL
Leukocytes,Ua: NEGATIVE
Nitrite: NEGATIVE
Protein, ur: NEGATIVE mg/dL
Specific Gravity, Urine: 1.023 (ref 1.005–1.030)
pH: 5 (ref 5.0–8.0)

## 2019-12-23 LAB — COMPREHENSIVE METABOLIC PANEL
ALT: 31 U/L (ref 0–44)
AST: 30 U/L (ref 15–41)
Albumin: 3.9 g/dL (ref 3.5–5.0)
Alkaline Phosphatase: 39 U/L (ref 38–126)
Anion gap: 13 (ref 5–15)
BUN: 18 mg/dL (ref 6–20)
CO2: 26 mmol/L (ref 22–32)
Calcium: 8.8 mg/dL — ABNORMAL LOW (ref 8.9–10.3)
Chloride: 103 mmol/L (ref 98–111)
Creatinine, Ser: 1 mg/dL (ref 0.61–1.24)
GFR calc Af Amer: >60 mL/min (ref >60)
GFR calc non Af Amer: >60 mL/min (ref >60)
Glucose, Bld: 95 mg/dL (ref 70–99)
Potassium: 4.7 mmol/L (ref 3.5–5.1)
Sodium: 142 mmol/L (ref 135–145)
Total Bilirubin: 0.6 mg/dL (ref 0.3–1.2)
Total Protein: 6.9 g/dL (ref 6.5–8.1)

## 2019-12-23 LAB — LIPASE, BLOOD: Lipase: 50 U/L (ref 11–51)

## 2019-12-23 LAB — SARS CORONAVIRUS 2 BY RT PCR (HOSPITAL ORDER, PERFORMED IN ~~LOC~~ HOSPITAL LAB): SARS Coronavirus 2: NEGATIVE

## 2019-12-23 MED ORDER — SODIUM CHLORIDE (PF) 0.9 % IJ SOLN
INTRAMUSCULAR | Status: AC
  Filled 2019-12-23: qty 50

## 2019-12-23 MED ORDER — IOHEXOL 300 MG/ML  SOLN
100.0000 mL | Freq: Once | INTRAMUSCULAR | Status: AC | PRN
  Administered 2019-12-23: 100 mL via INTRAVENOUS

## 2019-12-23 MED ORDER — SODIUM CHLORIDE 0.9 % IV BOLUS
500.0000 mL | Freq: Once | INTRAVENOUS | Status: AC
  Administered 2019-12-23: 500 mL via INTRAVENOUS

## 2019-12-23 NOTE — ED Notes (Signed)
Pt transported to CT ?

## 2019-12-23 NOTE — ED Triage Notes (Signed)
Patient reports left flank pain x 2 days. Pain rated 10/10.

## 2019-12-23 NOTE — ED Provider Notes (Signed)
[facility]-EMERGENCY DEPT Provider Note   CSN: [id number] Arrival date & time: 12/23/19  1001   History Chief Complaint  Patient presents with  . Flank Pain    Thomas Salinas is a 44 y.o. male with past medical history significant for Down syndrome, fatty liver disease, lap chole who presents for evaluation of left flank pain.  Pain began 2 days ago.  No recent injury or trauma.  Pain rated a 10/10 at worse, currently pain 1/10. Intermittent in nature. Radiates into left abdomen.  No testicular pain, swelling.  Denies fever, chills, nausea, vomiting, chest pain, shortness of breath, cough, hemoptysis, dysuria, hematuria.  Denies additional aggravating or alleviating factors. No rashes or lesions. Last BM this morning without melena or BRBPR.  History obtained from patient, father in room and past medical records. No interpretor was used.  HPI     Past Medical History:  Diagnosis Date  . Down's syndrome   . Fatty liver     Patient Active Problem List   Diagnosis Date Noted  . Down syndrome 07/28/2012  . Gout 07/28/2012    Past Surgical History:  Procedure Laterality Date  . CARDIAC SURGERY     av canal repair, mitral valve replacemt  . CHOLECYSTECTOMY N/A 09/12/2012   Procedure: LAPAROSCOPIC CHOLECYSTECTOMY;  Surgeon: Axel Filler, MD;  Location: [facility]S;  Service: General;  Laterality: N/A;  . EYE SURGERY  1981   strabismus  . HERNIA REPAIR    . MYRINGOTOMY  1989   left ear       Family History  Problem Relation Age of Onset  . Heart disease Father   . Hypertension Mother     Social History   Tobacco Use  . Smoking status: Never Smoker  . Smokeless tobacco: Never Used  Substance Use Topics  . Alcohol use: No  . Drug use: No    Home Medications Prior to Admission medications   Medication Sig Start Date End Date Taking? Authorizing Provider  allopurinol (ZYLOPRIM) 100 MG tablet Take 100 mg by mouth daily.   Yes [provider]  naproxen sodium (ANAPROX) 220 MG tablet Take 220 mg by mouth 2 (two) times daily as needed (for pain).   Yes [provider]  HYDROcodone-acetaminophen (NORCO/VICODIN) 5-325 MG per tablet Take 1-2 tablets by mouth every 4 (four) hours as needed. Patient not taking: Reported on 12/23/2019 09/08/12   Nonie Hoyer, PA-C  oxyCODONE-acetaminophen (ROXICET) 5-325 MG per tablet Take 1 tablet by mouth every 4 (four) hours as needed for pain. Patient not taking: Reported on 12/23/2019 09/13/12   Axel Filler, MD    Allergies    Patient has no known allergies.  Review of Systems   Review of Systems  Constitutional: Negative.   HENT: Negative.   Respiratory: Negative.   Cardiovascular: Negative.   Gastrointestinal: Positive for abdominal pain. Negative for abdominal distention, anal bleeding, blood in stool, constipation, diarrhea, nausea, rectal pain and vomiting.  Genitourinary: Positive for flank pain. Negative for decreased urine volume, difficulty urinating, discharge, dysuria, frequency, genital sores, hematuria, penile pain, penile swelling, scrotal swelling, testicular pain and urgency.  Skin: Negative.   Neurological: Negative.   All other systems reviewed and are negative.   Physical Exam Updated Vital Signs BP 114/71 (BP Location: Right Arm)   Pulse 66   Temp 98.8 F (37.1 C) (Oral)   Resp 18   Ht 5\' 1"  (1.549 m)   Wt 79.4 kg   SpO2 90%  BMI 33.07 kg/m   Physical Exam Vitals and nursing note reviewed.  Constitutional:      General: He is not in acute distress.    Appearance: He is well-developed. He is not ill-appearing, toxic-appearing or diaphoretic.  HENT:     Head: Normocephalic and atraumatic.     Nose: Nose normal.     Mouth/Throat:     Mouth: Mucous membranes are moist.  Eyes:     Pupils: Pupils are equal, round, and reactive to light.  Cardiovascular:     Rate and Rhythm: Normal rate and regular rhythm.     Pulses: Normal pulses.       Heart sounds: Normal heart sounds.  Pulmonary:     Effort: Pulmonary effort is normal. No respiratory distress.     Breath sounds: Normal breath sounds.     Comments: Clear to auscultation bilaterally. Abdominal:     General: Bowel sounds are normal. There is no distension.     Palpations: Abdomen is soft. There is no mass.     Tenderness: There is no abdominal tenderness. There is no right CVA tenderness, left CVA tenderness, guarding or rebound.     Hernia: No hernia is present.  Musculoskeletal:        General: No swelling, tenderness, deformity or signs of injury. Normal range of motion.     Cervical back: Normal range of motion and neck supple.     Right lower leg: No edema.     Left lower leg: No edema.     Comments: Moves all 4 extremities without difficulty. Compartments soft. Homans sign negative  Skin:    General: Skin is warm and dry.     Capillary Refill: Capillary refill takes 2 to 3 seconds.     Comments: No rashes or lesions  Neurological:     General: No focal deficit present.     Mental Status: He is alert and oriented to person, place, and time.    ED Results / Procedures / Treatments   Labs (all labs ordered are listed, but only abnormal results are displayed) Labs Reviewed  CBC WITH DIFFERENTIAL/PLATELET - Abnormal; Notable for the following components:      Result Value   WBC 2.7 (*)    Hemoglobin 17.6 (*)    Platelets 68 (*)    nRBC 19.5 (*)    Lymphs Abs 0.1 (*)    Monocytes Absolute 0.0 (*)    All other components within normal limits  COMPREHENSIVE METABOLIC PANEL - Abnormal; Notable for the following components:   Calcium 8.8 (*)    All other components within normal limits  URINE CULTURE  SARS CORONAVIRUS 2 BY RT PCR (HOSPITAL ORDER, PERFORMED IN [facility])  URINALYSIS, ROUTINE W REFLEX MICROSCOPIC  LIPASE, BLOOD    EKG None  Radiology CT ABDOMEN PELVIS W CONTRAST  Result Date: 12/23/2019 CLINICAL DATA:  Two day  history of severe LEFT flank pain (rated 10/10 by the patient). EXAM: CT ABDOMEN AND PELVIS WITH CONTRAST TECHNIQUE: Multidetector CT imaging of the abdomen and pelvis was performed using the standard protocol following bolus administration of intravenous contrast. CONTRAST:  100mL OMNIPAQUE IOHEXOL 300 MG/ML IV. COMPARISON:  Unenhanced CT abdomen and pelvis 03/27/2012. FINDINGS: Lower chest: Scattered very small peripheral ground-glass opacities involving the visualized lower lobes. Scarring in the RIGHT MIDDLE LOBE and lingula. Visualized lung bases otherwise clear. Hepatobiliary: Diffuse hepatic steatosis without focal hepatic parenchymal abnormalities. Relative enlargement of the LEFT lobe and caudate lobe when  compared to the RIGHT lobe. Surgically absent gallbladder. No biliary ductal dilation. Pancreas: Normal in appearance without evidence of mass, ductal dilation, or inflammation. Spleen: Normal in size. Approximate 1.2 cm solitary low-attenuation lesion in the posterior spleen just above the hilum, not conspicuous on the prior unenhanced study. Adrenals/Urinary Tract: Normal appearing adrenal glands. Kidneys normal in size and appearance without focal parenchymal abnormality. No hydronephrosis. No evidence of urinary tract calculi. Normal appearing urinary bladder. Stomach/Bowel: Stomach normal in appearance for the degree of distention. Normal-appearing small bowel. Entire colon relatively decompressed with expected stool burden. High attenuation material throughout the normal appearing appendix which may represent multiple appendicoliths. Vascular/Lymphatic: No visible aorto-iliofemoral atherosclerosis. Incompletely opacified or filling defect involving the portal vein. No pathologic lymphadenopathy. Reproductive: Prostate gland and seminal vesicles normal in size and appearance for age. Other: Umbilical hernia and midline supraumbilical abdominal wall hernia, both containing fat. RIGHT inguinal hernia  containing fat. Musculoskeletal: Degenerative disc disease and spondylosis with ANTERIOR osteophytes at L1-2 and L2-3. No acute findings. IMPRESSION: 1. Incompletely opacified portal vein or filling defect involving the portal vein. Doppler ultrasound evaluation of the portal vein is recommended to determine if there is thrombus or if the finding is related to mixing of unopacified blood. 2. Diffuse hepatic steatosis without focal hepatic parenchymal abnormalities. Morphologic findings of the liver which are highly suggestive of hepatic cirrhosis. 3. Approximate 1.2 cm solitary low-attenuation lesion in the posterior spleen, not conspicuous on the prior unenhanced study, likely a benign cyst or hemangioma. 4. Umbilical hernia and midline supraumbilical abdominal wall hernia, both containing fat. 5. RIGHT inguinal hernia containing fat. 6. Scattered very small peripheral ground-glass opacities involving the visualized lower lobes, likely inflammatory or postinflammatory. Electronically Signed   By: Hulan Saas M.D.   On: 12/23/2019 15:20    Procedures Procedures (including critical care time)  Medications Ordered in ED Medications  sodium chloride (PF) 0.9 % injection (has no administration in time range)  sodium chloride 0.9 % bolus 500 mL (500 mLs Intravenous New Bag/Given (Non-Interop) 12/23/19 1351)  iohexol (OMNIPAQUE) 300 MG/ML solution 100 mL (100 mLs Intravenous Contrast Given 12/23/19 1451)   ED Course  I have reviewed the triage vital signs and the nursing notes.  Pertinent labs & imaging results that were available during my care of the patient were reviewed by me and considered in my medical decision making (see chart for details).  44 year old with history of Down syndrome presents for evaluation of left flank and left abdominal pain.  Intermittent symptoms over the last 2 days.  Last bowel movement this morning without melena or bright red blood per rectum.  No upper respiratory  symptoms, cough, and unilateral leg swelling, redness or warmth.  He is without tachycardia, tachypnea or hypoxia.  His heart and lungs are clear.  I have low suspicion for atypical ACS, PE, dissection, pneumothorax, upper respiratory infection as cause of his symptoms.  He has no overlying skin changes.  I am not able to reproduce his symptoms on exam.  He has no urinary complaints.  GU exam without significant abnormality. No pain currently. Plan on labs, imaging and reassess.  Labs and imagine personally reviewed and interpreted:  UA negative for infection CBC leukopenia , low platelets 68, Smudge cells, plan on contacting hematology to determine significance. No active bleeding on exam CMP calcium 8.8 no additional electrolyte, renal or liver abnormalities Lipase 50 Urine culture pending CT AP with possible filling defect to portal vein. Recommend [place] to r/o occlusion.  Fatty liver possible cirrhosis, Spleen with possible cyst.  Bilateral lower lobe groundglass opacities.  Patient is not having any respiratory complaints.  Will obtain Covid test given leukopenia  Patient reassessed. Denies current pain. Discussed findings on CT and labs. Plan on [place].  Patient reassessed. Comfortable. Denies needing pain medications. Ambulatory in ED without difficulty. Apparently according to secretary [place] tech did not see order or US doppler for PV. Secretary made [place] aware and they will be over to perform.  Care transferred to [facility], PA-C who will follow up on imaging and labs and determine disposition. Plan on consult with Hematology for leukopenia, low platlets with smudge cells.      MDM Rules/Calculators/A&P                           Final Clinical Impression(s) / ED Diagnoses Final diagnoses:  Pain  Lymphopenia  Platelets decreased (HCC)  Left flank pain    Rx / DC Orders ED Discharge Orders    None       Tranika Scholler A, PA-C 12/23/19 1745    Cathren Laine, MD 12/24/19 [phone]

## 2019-12-23 NOTE — Discharge Instructions (Addendum)
A mass on the Spleen was found which radiology thought was a possible cyst -- Will need to be followed up outpatient with repeat imaging in 6 months.  You will receive a phone call from Dr. Mosetta Putt, hematology/oncology, about scheduling appointment for outpatient follow-up next week.  If your left-sided flank/lateral abdominal discomfort returns, please try managing with Tylenol or heating pads.  Return to the ED or seek immediate medical attention should experience any new or worsening symptoms.

## 2019-12-23 NOTE — ED Provider Notes (Signed)
Received patient as a handoff at shift change Britni Henderly, PA-C.  In short, patient is a 44 year old male with PMH of Down syndrome, fatty liver disease, and surgical history of cholecystectomy who presented to the ED with a 2-day history of atraumatic left-sided flank/abdominal discomfort.  While patient noted 10 out of 10 pain in triage, his pain was a 1 out of 10 during handoff provider's exam.  He has not required any analgesics or antiemetics while here in the ED.  He denies any fevers or chills, nausea or vomiting, chest pain or difficulty breathing, cough or hemoptysis, dysuria or hematuria, or changes in bowel habits.  Last BM was this morning and normal.  He is accompanied by his father who is at bedside.  Physical Exam  BP 100/65   Pulse 68   Temp 98.8 F (37.1 C) (Oral)   Resp 18   Ht 5\' 1"  (1.549 m)   Wt 79.4 kg   SpO2 94%   BMI 33.07 kg/m   Physical Exam Vitals and nursing note reviewed. Exam conducted with a chaperone present.  Constitutional:      Appearance: He is not ill-appearing.  HENT:     Head: Normocephalic and atraumatic.  Eyes:     General: No scleral icterus.    Conjunctiva/sclera: Conjunctivae normal.  Cardiovascular:     Rate and Rhythm: Normal rate and regular rhythm.     Pulses: Normal pulses.     Heart sounds: Normal heart sounds.  Pulmonary:     Effort: Pulmonary effort is normal. No respiratory distress.     Breath sounds: Normal breath sounds.  Abdominal:     General: Abdomen is flat. There is no distension.     Palpations: Abdomen is soft.     Tenderness: There is no abdominal tenderness.  Skin:    General: Skin is dry.  Neurological:     Mental Status: He is alert.     GCS: GCS eye subscore is 4. GCS verbal subscore is 5. GCS motor subscore is 6.  Psychiatric:        Mood and Affect: Mood normal.        Behavior: Behavior normal.        Thought Content: Thought content normal.     ED Course/Procedures     Procedures Results  for orders placed or performed during the hospital encounter of 12/23/19  SARS Coronavirus 2 by RT PCR (hospital order, performed in [facility] lab) Nasopharyngeal Nasopharyngeal Swab   Specimen: Nasopharyngeal Swab  Result Value Ref Range   SARS Coronavirus 2 NEGATIVE NEGATIVE  Urinalysis, Routine w reflex microscopic- may I&O cath if menses  Result Value Ref Range   Color, Urine YELLOW YELLOW   APPearance CLEAR CLEAR   Specific Gravity, Urine 1.023 1.005 - 1.030   pH 5.0 5.0 - 8.0   Glucose, UA NEGATIVE NEGATIVE mg/dL   Hgb urine dipstick NEGATIVE NEGATIVE   Bilirubin Urine NEGATIVE NEGATIVE   Ketones, ur NEGATIVE NEGATIVE mg/dL   Protein, ur NEGATIVE NEGATIVE mg/dL   Nitrite NEGATIVE NEGATIVE   Leukocytes,Ua NEGATIVE NEGATIVE  CBC with Differential  Result Value Ref Range   WBC 2.7 (L) 4.0 - 10.5 K/uL   RBC 5.21 4.22 - 5.81 MIL/uL   Hemoglobin 17.6 (H) 13.0 - 17.0 g/dL   HCT 40.951.6 39 - 52 %   MCV 99.0 80.0 - 100.0 fL   MCH 33.8 26.0 - 34.0 pg   MCHC 34.1 30.0 - 36.0 g/dL  RDW 13.5 11.5 - 15.5 %   Platelets 68 (L) 150 - 400 K/uL   nRBC 19.5 (H) 0.0 - 0.2 %   Neutrophils Relative % 82 %   Neutro Abs 2.2 1.7 - 7.7 K/uL   Lymphocytes Relative 5 %   Lymphs Abs 0.1 (L) 0.7 - 4.0 K/uL   Monocytes Relative 2 %   Monocytes Absolute 0.0 (L) 0 - 1 K/uL   Eosinophils Relative 7 %   Eosinophils Absolute 0.2 0 - 0 K/uL   Basophils Relative 4 %   Basophils Absolute 0.1 0 - 0 K/uL   WBC Morphology SMUDGE CELLS    Immature Granulocytes 0 %   Abs Immature Granulocytes 0.01 0.00 - 0.07 K/uL  Comprehensive metabolic panel  Result Value Ref Range   Sodium 142 135 - 145 mmol/L   Potassium 4.7 3.5 - 5.1 mmol/L   Chloride 103 98 - 111 mmol/L   CO2 26 22 - 32 mmol/L   Glucose, Bld 95 70 - 99 mg/dL   BUN 18 6 - 20 mg/dL   Creatinine, Ser 0.35 0.61 - 1.24 mg/dL   Calcium 8.8 (L) 8.9 - 10.3 mg/dL   Total Protein 6.9 6.5 - 8.1 g/dL   Albumin 3.9 3.5 - 5.0 g/dL   AST 30 15 - 41  U/L   ALT 31 0 - 44 U/L   Alkaline Phosphatase 39 38 - 126 U/L   Total Bilirubin 0.6 0.3 - 1.2 mg/dL   GFR calc non Af Amer >60 >60 mL/min   GFR calc Af Amer >60 >60 mL/min   Anion gap 13 5 - 15  Lipase, blood  Result Value Ref Range   Lipase 50 11 - 51 U/L   CT ABDOMEN PELVIS W CONTRAST  Result Date: 12/23/2019 CLINICAL DATA:  Two day history of severe LEFT flank pain (rated 10/10 by the patient). EXAM: CT ABDOMEN AND PELVIS WITH CONTRAST TECHNIQUE: Multidetector CT imaging of the abdomen and pelvis was performed using the standard protocol following bolus administration of intravenous contrast. CONTRAST:  OMNIPAQUE IOHEXOL 300 MG/ML IV. COMPARISON:  Unenhanced CT abdomen and pelvis 03/27/2012. FINDINGS: Lower chest: Scattered very small peripheral ground-glass opacities involving the visualized lower lobes. Scarring in the RIGHT MIDDLE LOBE and lingula. Visualized lung bases otherwise clear. Hepatobiliary: Diffuse hepatic steatosis without focal hepatic parenchymal abnormalities. Relative enlargement of the LEFT lobe and caudate lobe when compared to the RIGHT lobe. Surgically absent gallbladder. No biliary ductal dilation. Pancreas: Normal in appearance without evidence of mass, ductal dilation, or inflammation. Spleen: Normal in size. Approximate 1.2 cm solitary low-attenuation lesion in the posterior spleen just above the hilum, not conspicuous on the prior unenhanced study. Adrenals/Urinary Tract: Normal appearing adrenal glands. Kidneys normal in size and appearance without focal parenchymal abnormality. No hydronephrosis. No evidence of urinary tract calculi. Normal appearing urinary bladder. Stomach/Bowel: Stomach normal in appearance for the degree of distention. Normal-appearing small bowel. Entire colon relatively decompressed with expected stool burden. High attenuation material throughout the normal appearing appendix which may represent multiple appendicoliths. Vascular/Lymphatic:  No visible aorto-iliofemoral atherosclerosis. Incompletely opacified or filling defect involving the portal vein. No pathologic lymphadenopathy. Reproductive: Prostate gland and seminal vesicles normal in size and appearance for age. Other: Umbilical hernia and midline supraumbilical abdominal wall hernia, both containing fat. RIGHT inguinal hernia containing fat. Musculoskeletal: Degenerative disc disease and spondylosis with ANTERIOR osteophytes at L1-2 and L2-3. No acute findings. IMPRESSION: 1. Incompletely opacified portal vein or filling defect involving the portal  vein. Doppler ultrasound evaluation of the portal vein is recommended to determine if there is thrombus or if the finding is related to mixing of unopacified blood. 2. Diffuse hepatic steatosis without focal hepatic parenchymal abnormalities. Morphologic findings of the liver which are highly suggestive of hepatic cirrhosis. 3. Approximate 1.2 cm solitary low-attenuation lesion in the posterior spleen, not conspicuous on the prior unenhanced study, likely a benign cyst or hemangioma. 4. Umbilical hernia and midline supraumbilical abdominal wall hernia, both containing fat. 5. RIGHT inguinal hernia containing fat. 6. Scattered very small peripheral ground-glass opacities involving the visualized lower lobes, likely inflammatory or postinflammatory. Electronically Signed   By: Hulan Saas M.D.   On: 12/23/2019 15:20   US LIVER DOPPLER LIMITED (PV OR SINGLE ART/VEIN)  Result Date: 12/23/2019 CLINICAL DATA:  Question of portal vein abnormality, follow-up of CT abnormality suggested on CT of the same date. EXAM: DUPLEX ULTRASOUND OF LIVER TECHNIQUE: Color and duplex Doppler ultrasound was performed to evaluate the question of portal vein thrombus which was raised on the CT evaluation of the same date. COMPARISON:  CT evaluation of 12/23/2019 FINDINGS: Liver: Course increased hepatic echogenicity. Mildly nodular and lobular hepatic contour.  Limited imaging of the liver shows no focal abnormality. No gross biliary ductal dilation is demonstrated. Main Portal Vein size: Difficult to assess size due to beam attenuation in the setting of hepatic steatosis approximately 1 cm. Cm Portal Vein Velocities Main Prox:  29 cm/sec Main Mid: 36 cm/sec Main Dist:  23 cm/sec Right: 22 cm/sec Left: 26 cm/sec Portal Vein Occlusion/Thrombus: No Ascites: None IMPRESSION: 1. Patent portal vein with hepatopetal flow. No signs of portal venous thrombus. Study was focused on portal vein and displays signs of liver disease. Hepatic veins were not assessed on this exam. Electronically Signed   By: Donzetta Kohut M.D.   On: 12/23/2019 18:54    MDM    Patient's vital signs are stable and normalized.  He is denying any upper respiratory infection symptoms.  Given that his left-sided flank/abdominal discomfort was not reproducible on exam, handoff provider obtained CT abdomen pelvis with contrast to assess.  Laboratory work-up was notable for leukopenia to 2.7, thrombocytopenia to 68, and smudge cells on morphology.  These findings are concerning for CLL.  Patient is also mildly hypocalcemic to 8.8.  Remainder of laboratory work-up is unremarkable.  CT abdomen pelvis was reviewed and demonstrates hepatic steatosis and possible morphologic changes concerning for cirrhosis.  There is also a 1.2 cm solitary low-attenuation lesion in the posterior spleen.  Most importantly however, there is concern for possible portal vein thrombus given findings of incompletely opacified portal vein/filling defect.  Recommendations were for US liver Doppler ultrasound for better evaluation.  At time of handoff, all that was remaining was radiologic interpretation of the US liver Doppler that was obtained.  If negative for thrombus, PA Henderly recommends consult to hematology/oncology.  On my examination, patient is resting comfortably in no acute distress.  He denies any pain or nausea  symptoms.  I spoke with Dr. Nadene Rubins, radiology, who states that there is patent portal vein with appropriate flow and no evidence of portal venous thrombus.  We will now consult hematology oncology regarding leukopenia and thrombocytopenia with smudge cell morphology.    I spoke with Dr. Malachy Mood with medical oncology who does not feel as though patient will need to be admitted based on his laboratory findings.  Instead, she will call him on Monday to schedule an appointment for outpatient  follow-up next week.  As for his reported flank/abdominal discomfort, his work-up today was entirely unremarkable.  He states that it is worse with sitting up and I suspect there could be a musculoskeletal component.  As of now, undifferentiated.  Recommending Tylenol and heating pads.  All of the evaluation and work-up results were discussed with the patient and any family at bedside.  Patient and/or family were informed that while patient is appropriate for discharge at this time, some medical emergencies may only develop or become detectable after a period of time.  I specifically instructed patient and/or family to return to return to the ED or seek immediate medical attention for any new or worsening symptoms.  They were provided opportunity to ask any additional questions and have none at this time.  Prior to discharge patient is feeling well, agreeable with plan for discharge home.  They have expressed understanding of verbal discharge instructions as well as return precautions and are agreeable to the plan.     Lorelee New, PA-C 12/23/19 2001    Vanetta Mulders, MD 12/24/19 1324

## 2019-12-26 ENCOUNTER — Telehealth: Payer: Self-pay | Admitting: Hematology and Oncology

## 2019-12-26 NOTE — Telephone Encounter (Signed)
Pt has been rescheduled to see Dr. Pamelia Hoit on 8/12 at 1pm due to his work schedule

## 2019-12-26 NOTE — Telephone Encounter (Signed)
Received a msg from Dr. Mosetta Putt to schedule Thomas Salinas for low wbc. Pt was seen in the hospital. Pt's mom is established with Dr. Pamelia Hoit and preferred her son see him. Thomas Salinas has been scheduled to see Dr. Pamelia Hoit on 8/10 at 1pm.

## 2020-01-02 ENCOUNTER — Encounter: Payer: Medicare Other | Admitting: Hematology and Oncology

## 2020-01-03 NOTE — Progress Notes (Signed)
[facility] CONSULT NOTE  Patient Care Team: Daisy Floro, MD as PCP - General (Family Medicine)  CHIEF COMPLAINTS/PURPOSE OF CONSULTATION:  Newly diagnosed leukopenia  HISTORY OF PRESENTING ILLNESS:  Thomas Salinas 44 y.o. male is here because of recent diagnosis of leukopenia. His was seen in the ED on 12/23/19 for left flank pain. CT abdomen/pelvis on 12/23/19 showed a completely opacified portal vein, hepatic steatosis, a 1.2cm splenic lesion, umbilical hernia, supraumbilical hernia, and right inguinal hernia. Liver US on 12/23/19 showed no evidence of portal vein thrombus. Labs showed WBC 2.7, ANC 2.2, ALC 0.1 He presents to the clinic today for initial evaluation.   I reviewed his records extensively and collaborated the history with the patient.  MEDICAL HISTORY:  Past Medical History:  Diagnosis Date   Down's syndrome    Fatty liver     SURGICAL HISTORY: Past Surgical History:  Procedure Laterality Date   CARDIAC SURGERY     av canal repair, mitral valve replacemt   CHOLECYSTECTOMY N/A 09/12/2012   Procedure: LAPAROSCOPIC CHOLECYSTECTOMY;  Surgeon: Axel Filler, MD;  Location: [facility];  Service: General;  Laterality: N/A;   EYE SURGERY  1981   strabismus   HERNIA REPAIR     MYRINGOTOMY  1989   left ear    SOCIAL HISTORY: Social History   Socioeconomic History   Marital status: Single    Spouse name: Not on file   Number of children: Not on file   Years of education: Not on file   Highest education level: Not on file  Occupational History   Not on file  Tobacco Use   Smoking status: Never Smoker   Smokeless tobacco: Never Used  Substance and Sexual Activity   Alcohol use: No   Drug use: No   Sexual activity: Not on file  Other Topics Concern   Not on file  Social History Narrative   Not on file   Social Determinants of Health   Financial Resource Strain:    Difficulty of Paying Living Expenses:   Food  Insecurity:    Worried About Programme researcher, broadcasting/film/video in the Last Year:    Barista in the Last Year:   Transportation Needs:    Freight forwarder (Medical):    Lack of Transportation (Non-Medical):   Physical Activity:    Days of Exercise per Week:    Minutes of Exercise per Session:   Stress:    Feeling of Stress :   Social Connections:    Frequency of Communication with Friends and Family:    Frequency of Social Gatherings with Friends and Family:    Attends Religious Services:    Active Member of Clubs or Organizations:    Attends Engineer, structural:    Marital Status:   Intimate Partner Violence:    Fear of Current or Ex-Partner:    Emotionally Abused:    Physically Abused:    Sexually Abused:     FAMILY HISTORY: Family History  Problem Relation Age of Onset   Heart disease Father    Hypertension Mother     ALLERGIES:  has No Known Allergies.  MEDICATIONS:  Current Outpatient Medications  Medication Sig Dispense Refill   allopurinol (ZYLOPRIM) 100 MG tablet Take 100 mg by mouth daily.     HYDROcodone-acetaminophen (NORCO/VICODIN) 5-325 MG per tablet Take 1-2 tablets by mouth every 4 (four) hours as needed. (Patient not taking: Reported on 12/23/2019) 40 tablet 0  naproxen sodium (ANAPROX) 220 MG tablet Take 220 mg by mouth 2 (two) times daily as needed (for pain).     oxyCODONE-acetaminophen (ROXICET) 5-325 MG per tablet Take 1 tablet by mouth every 4 (four) hours as needed for pain. (Patient not taking: Reported on 12/23/2019) 30 tablet 0   No current facility-administered medications for this visit.    REVIEW OF SYSTEMS:   Constitutional: Denies fevers, chills or abnormal night sweats Eyes: Denies blurriness of vision, double vision or watery eyes Ears, nose, mouth, throat, and face: Denies mucositis or sore throat Respiratory: Denies cough, dyspnea or wheezes Cardiovascular: Denies palpitation, chest discomfort or lower  extremity swelling Gastrointestinal:  Denies nausea, heartburn or change in bowel habits Skin: Denies abnormal skin rashes Lymphatics: Denies new lymphadenopathy or easy bruising Neurological:Denies numbness, tingling or new weaknesses Behavioral/Psych: Mood is stable, no new changes  All other systems were reviewed with the patient and are negative.  PHYSICAL EXAMINATION: ECOG PERFORMANCE STATUS: 1 - Symptomatic but completely ambulatory  Vitals:   01/04/20 1308  BP: 101/65  Pulse: 73  Resp: 17  Temp: 98.5 F (36.9 C)  SpO2: 97%   Filed Weights   01/04/20 1308  Weight: 168 lb 14.4 oz (76.6 kg)    GENERAL:alert, no distress and comfortable SKIN: skin color, texture, turgor are normal, no rashes or significant lesions EYES: normal, conjunctiva are pink and non-injected, sclera clear OROPHARYNX:no exudate, no erythema and lips, buccal mucosa, and tongue normal  NECK: supple, thyroid normal size, non-tender, without nodularity LYMPH:  no palpable lymphadenopathy in the cervical, axillary or inguinal LUNGS: clear to auscultation and percussion with normal breathing effort HEART: regular rate & rhythm and no murmurs and no lower extremity edema ABDOMEN:abdomen soft, non-tender and normal bowel sounds Musculoskeletal:no cyanosis of digits and no clubbing  PSYCH: alert & oriented x 3 with fluent speech NEURO: no focal motor/sensory deficits  LABORATORY DATA:  I have reviewed the data as listed Lab Results  Component Value Date   WBC 2.7 (L) 12/23/2019   HGB 17.6 (H) 12/23/2019   HCT 51.6 12/23/2019   MCV 99.0 12/23/2019   PLT 68 (L) 12/23/2019   Lab Results  Component Value Date   NA 142 12/23/2019   K 4.7 12/23/2019   CL 103 12/23/2019   CO2 26 12/23/2019    RADIOGRAPHIC STUDIES: I have personally reviewed the radiological reports and agreed with the findings in the report.  ASSESSMENT AND PLAN:  Leukopenia Lab review 12/23/2019: In the emergency room for flank  pain: WBC 2.7, hemoglobin 17.6, platelets 68, ANC 2.2, ALC 0.1 Plan: Verify if the CBC is accurate with recheck of CBC with differential today. I will obtain B12, immature platelet fraction, reticulocyte count  Secondary polycythemia: Most likely related to obstructive sleep apnea. We will obtain JAK2 mutation testing I will call the patient with the result of this test. I instructed the patient's mom Marylu Lund to call Dr. Tenny Craw and schedule a sleep study  Thrombocytopenia and lymphopenia: Repeat testing showed normalization of blood counts. Therefore no further testing will be done.  We will need to see him if the JAK2 mutation is positive as that would indicate a primary polycythemia and he would need phlebotomies. If it is secondary polycythemia then we need to treat the underlying cause which in his case most likely could be obstructive sleep apnea.. Return to clinic on an as-needed basis.  All questions were answered. The patient knows to call the clinic with any problems, questions or  concerns.   Sabas Sous, MD, MPH 01/04/2020    I, Molly Dorshimer, am acting as scribe for Serena Croissant, MD.  I have reviewed the above documentation for accuracy and completeness, and I agree with the above.

## 2020-01-04 ENCOUNTER — Inpatient Hospital Stay: Payer: Medicare Other | Attending: Hematology and Oncology | Admitting: Hematology and Oncology

## 2020-01-04 ENCOUNTER — Inpatient Hospital Stay: Payer: Medicare Other

## 2020-01-04 ENCOUNTER — Other Ambulatory Visit: Payer: Self-pay

## 2020-01-04 VITALS — BP 101/65 | HR 73 | Temp 98.5°F | Resp 17 | Ht 61.0 in | Wt 168.9 lb

## 2020-01-04 DIAGNOSIS — K760 Fatty (change of) liver, not elsewhere classified: Secondary | ICD-10-CM | POA: Insufficient documentation

## 2020-01-04 DIAGNOSIS — K429 Umbilical hernia without obstruction or gangrene: Secondary | ICD-10-CM | POA: Diagnosis not present

## 2020-01-04 DIAGNOSIS — D751 Secondary polycythemia: Secondary | ICD-10-CM

## 2020-01-04 DIAGNOSIS — R109 Unspecified abdominal pain: Secondary | ICD-10-CM | POA: Insufficient documentation

## 2020-01-04 DIAGNOSIS — Q909 Down syndrome, unspecified: Secondary | ICD-10-CM | POA: Diagnosis not present

## 2020-01-04 DIAGNOSIS — R5383 Other fatigue: Secondary | ICD-10-CM | POA: Diagnosis not present

## 2020-01-04 DIAGNOSIS — D509 Iron deficiency anemia, unspecified: Secondary | ICD-10-CM | POA: Diagnosis not present

## 2020-01-04 DIAGNOSIS — D72819 Decreased white blood cell count, unspecified: Secondary | ICD-10-CM | POA: Insufficient documentation

## 2020-01-04 DIAGNOSIS — D61818 Other pancytopenia: Secondary | ICD-10-CM | POA: Diagnosis not present

## 2020-01-04 DIAGNOSIS — E538 Deficiency of other specified B group vitamins: Secondary | ICD-10-CM

## 2020-01-04 DIAGNOSIS — Z79899 Other long term (current) drug therapy: Secondary | ICD-10-CM | POA: Insufficient documentation

## 2020-01-04 DIAGNOSIS — K4090 Unilateral inguinal hernia, without obstruction or gangrene, not specified as recurrent: Secondary | ICD-10-CM | POA: Insufficient documentation

## 2020-01-04 DIAGNOSIS — D72810 Lymphocytopenia: Secondary | ICD-10-CM

## 2020-01-04 LAB — CBC WITH DIFFERENTIAL (CANCER CENTER ONLY)
Abs Immature Granulocytes: 0.04 10*3/uL (ref 0.00–0.07)
Basophils Absolute: 0.1 10*3/uL (ref 0.0–0.1)
Basophils Relative: 1 %
Eosinophils Absolute: 0.1 10*3/uL (ref 0.0–0.5)
Eosinophils Relative: 1 %
HCT: 52.7 % — ABNORMAL HIGH (ref 39.0–52.0)
Hemoglobin: 18.3 g/dL — ABNORMAL HIGH (ref 13.0–17.0)
Immature Granulocytes: 1 %
Lymphocytes Relative: 27 %
Lymphs Abs: 1.8 10*3/uL (ref 0.7–4.0)
MCH: 33.3 pg (ref 26.0–34.0)
MCHC: 34.7 g/dL (ref 30.0–36.0)
MCV: 96 fL (ref 80.0–100.0)
Monocytes Absolute: 0.7 10*3/uL (ref 0.1–1.0)
Monocytes Relative: 10 %
Neutro Abs: 3.9 10*3/uL (ref 1.7–7.7)
Neutrophils Relative %: 60 %
Platelet Count: 185 10*3/uL (ref 150–400)
RBC: 5.49 MIL/uL (ref 4.22–5.81)
RDW: 13.1 % (ref 11.5–15.5)
WBC Count: 6.5 10*3/uL (ref 4.0–10.5)
nRBC: 0 % (ref 0.0–0.2)

## 2020-01-04 LAB — VITAMIN B12: Vitamin B-12: 286 pg/mL (ref 180–914)

## 2020-01-04 LAB — FERRITIN: Ferritin: 676 ng/mL — ABNORMAL HIGH (ref 24–336)

## 2020-01-04 LAB — IMMATURE PLATELET FRACTION: Immature Platelet Fraction: 2 % (ref 1.2–8.6)

## 2020-01-04 NOTE — Assessment & Plan Note (Addendum)
Lab review 12/23/2019: In the emergency room for flank pain: WBC 2.7, hemoglobin 17.6, platelets 68, ANC 2.2, ALC 0.1 Plan: Verify if the CBC is accurate with recheck of CBC with differential today. I will obtain B12, immature platelet fraction, reticulocyte count  Secondary polycythemia: Most likely related to obstructive sleep apnea. We will obtain JAK2 mutation testing  Thrombocytopenia: Could be secondary to splenomegaly related to fatty liver and cirrhosis. Lymphopenia: Unclear etiology. I recommended watchful monitoring and recheck of labs in 1 month. If they are persistently low then we will perform a bone marrow biopsy.  

## 2020-01-08 ENCOUNTER — Telehealth: Payer: Self-pay | Admitting: Hematology and Oncology

## 2020-01-08 NOTE — Telephone Encounter (Signed)
No 8/12 los, no changes made to pt schedule  

## 2020-01-15 LAB — JAK2 (INCLUDING V617F AND EXON 12), MPL,& CALR W/RFL MPN PANEL (NGS)

## 2020-06-18 ENCOUNTER — Other Ambulatory Visit: Payer: Medicare Other

## 2020-06-18 DIAGNOSIS — Z20822 Contact with and (suspected) exposure to covid-19: Secondary | ICD-10-CM

## 2020-06-20 LAB — SARS-COV-2, NAA 2 DAY TAT

## 2020-06-20 LAB — NOVEL CORONAVIRUS, NAA: SARS-CoV-2, NAA: NOT DETECTED

## 2020-07-31 LAB — LIPID PANEL
Cholesterol: 190 (ref 0–200)
Cholesterol: 190 (ref 0–200)
Cholesterol: 190 (ref 0–200)
HDL: 33 — AB (ref 35–70)
HDL: 33 — AB (ref 35–70)
HDL: 33 — AB (ref 35–70)
LDL Cholesterol: 107
LDL Cholesterol: 107
Triglycerides: 287 — AB (ref 40–160)
Triglycerides: 287 — AB (ref 40–160)
Triglycerides: 287 — AB (ref 40–160)

## 2020-07-31 LAB — HEPATIC FUNCTION PANEL
ALT: 29 (ref 10–40)
AST: 25 (ref 14–40)
Alkaline Phosphatase: 48 (ref 25–125)
Bilirubin, Total: 0.6

## 2020-07-31 LAB — BASIC METABOLIC PANEL
BUN: 12 (ref 4–21)
BUN: 12 (ref 4–21)
CO2: 32 — AB (ref 13–22)
CO2: 32 — AB (ref 13–22)
Chloride: 99 (ref 99–108)
Chloride: 99 (ref 99–108)
Creatinine: 1 (ref 0.6–1.3)
Creatinine: 1 (ref 0.6–1.3)
Glucose: 95
Glucose: 95
Potassium: 4.5 (ref 3.4–5.3)
Potassium: 4.5 (ref 3.4–5.3)
Sodium: 135 — AB (ref 137–147)
Sodium: 135 — AB (ref 137–147)

## 2020-07-31 LAB — COMPREHENSIVE METABOLIC PANEL
Albumin: 4 (ref 3.5–5.0)
Albumin: 4 (ref 3.5–5.0)
Calcium: 9.1 (ref 8.7–10.7)
Calcium: 9.1 (ref 8.7–10.7)
GFR calc Af Amer: 100
GFR calc non Af Amer: 83

## 2021-05-01 ENCOUNTER — Telehealth: Payer: Self-pay

## 2021-05-01 NOTE — Telephone Encounter (Signed)
Please advise 

## 2021-05-01 NOTE — Telephone Encounter (Signed)
Yes that is ok.  Katina Degree. [name], MD 05/01/2021 3:46 PM

## 2021-05-01 NOTE — Telephone Encounter (Signed)
Brad's father, Othman Masur called wanting to know if Dr [name] will accept him as a new patient. Mirko Tailor is a current pt of Dr [name]. Can we schedule a NP appt? Please Advise.

## 2021-05-02 NOTE — Telephone Encounter (Signed)
Pt is scheduled °

## 2021-05-02 NOTE — Telephone Encounter (Signed)
Please schedule new patient appointment.  

## 2021-05-30 ENCOUNTER — Other Ambulatory Visit: Payer: Self-pay

## 2021-05-30 ENCOUNTER — Encounter: Payer: Self-pay | Admitting: Family Medicine

## 2021-05-30 ENCOUNTER — Ambulatory Visit (INDEPENDENT_AMBULATORY_CARE_PROVIDER_SITE_OTHER): Payer: Commercial Managed Care - HMO | Admitting: Family Medicine

## 2021-05-30 VITALS — BP 112/72 | HR 68 | Temp 98.1°F | Ht 60.0 in | Wt 168.4 lb

## 2021-05-30 DIAGNOSIS — Z6832 Body mass index (BMI) 32.0-32.9, adult: Secondary | ICD-10-CM

## 2021-05-30 DIAGNOSIS — Z23 Encounter for immunization: Secondary | ICD-10-CM | POA: Diagnosis not present

## 2021-05-30 DIAGNOSIS — K089 Disorder of teeth and supporting structures, unspecified: Secondary | ICD-10-CM

## 2021-05-30 DIAGNOSIS — Z0001 Encounter for general adult medical examination with abnormal findings: Secondary | ICD-10-CM | POA: Diagnosis not present

## 2021-05-30 DIAGNOSIS — Z13220 Encounter for screening for lipoid disorders: Secondary | ICD-10-CM | POA: Diagnosis not present

## 2021-05-30 DIAGNOSIS — Q909 Down syndrome, unspecified: Secondary | ICD-10-CM

## 2021-05-30 DIAGNOSIS — R739 Hyperglycemia, unspecified: Secondary | ICD-10-CM

## 2021-05-30 DIAGNOSIS — M109 Gout, unspecified: Secondary | ICD-10-CM | POA: Diagnosis not present

## 2021-05-30 DIAGNOSIS — E669 Obesity, unspecified: Secondary | ICD-10-CM

## 2021-05-30 LAB — COMPREHENSIVE METABOLIC PANEL
ALT: 23 U/L (ref 0–53)
AST: 20 U/L (ref 0–37)
Albumin: 3.8 g/dL (ref 3.5–5.2)
Alkaline Phosphatase: 66 U/L (ref 39–117)
BUN: 17 mg/dL (ref 6–23)
CO2: 30 mEq/L (ref 19–32)
Calcium: 9 mg/dL (ref 8.4–10.5)
Chloride: 104 mEq/L (ref 96–112)
Creatinine, Ser: 1.03 mg/dL (ref 0.40–1.50)
GFR: 89.27 mL/min (ref >60.00)
Glucose, Bld: 90 mg/dL (ref 70–99)
Potassium: 4.8 mEq/L (ref 3.5–5.1)
Sodium: 142 mEq/L (ref 135–145)
Total Bilirubin: 0.4 mg/dL (ref 0.2–1.2)
Total Protein: 6.5 g/dL (ref 6.0–8.3)

## 2021-05-30 LAB — LIPID PANEL
Cholesterol: 195 mg/dL (ref 0–200)
HDL: 33.1 mg/dL — ABNORMAL LOW (ref >39.00)
NonHDL: 161.51
Total CHOL/HDL Ratio: 6
Triglycerides: 223 mg/dL — ABNORMAL HIGH (ref 0.0–149.0)
VLDL: 44.6 mg/dL — ABNORMAL HIGH (ref 0.0–40.0)

## 2021-05-30 LAB — CBC
HCT: 49.2 % (ref 39.0–52.0)
Hemoglobin: 16.6 g/dL (ref 13.0–17.0)
MCHC: 33.7 g/dL (ref 30.0–36.0)
MCV: 97.7 fl (ref 78.0–100.0)
Platelets: 191 10*3/uL (ref 150.0–400.0)
RBC: 5.04 Mil/uL (ref 4.22–5.81)
RDW: 14.1 % (ref 11.5–15.5)
WBC: 7.5 10*3/uL (ref 4.0–10.5)

## 2021-05-30 LAB — HEMOGLOBIN A1C: Hgb A1c MFr Bld: 6.1 % (ref 4.6–6.5)

## 2021-05-30 LAB — LDL CHOLESTEROL, DIRECT: Direct LDL: 141 mg/dL

## 2021-05-30 LAB — TSH: TSH: 1.85 u[IU]/mL (ref 0.35–5.50)

## 2021-05-30 LAB — URIC ACID: Uric Acid, Serum: 6.6 mg/dL (ref 4.0–7.8)

## 2021-05-30 MED ORDER — ALLOPURINOL 100 MG PO TABS: 100.0000 mg | ORAL_TABLET | Freq: Every day | ORAL | 3 refills | Status: DC

## 2021-05-30 NOTE — Assessment & Plan Note (Signed)
No recent flares.  Continue allopurinol 100 mg daily. 

## 2021-05-30 NOTE — Patient Instructions (Signed)
It was very nice to see you today!  We will check blood work.  Please continue working on diet and exercise.  We have your flu shot today.  Please come back to see [place] in a year for your next check up.  Come back sooner if needed.  Take care, Dr [name]  PLEASE NOTE:  If you had any lab tests please let us know if you have not heard back within a few days. You may see your results on mychart before we have a chance to review them but we will give you a call once they are reviewed by [place]. If we ordered any referrals today, please let us know if you have not heard from their office within the next week.   Please try these tips to maintain a healthy lifestyle:  Eat at least 3 REAL meals and 1-2 snacks per day.  Aim for no more than 5 hours between eating.  If you eat breakfast, please do so within one hour of getting up.   Each meal should contain half fruits/vegetables, one quarter protein, and one quarter carbs (no bigger than a computer mouse)  Cut down on sweet beverages. This includes juice, soda, and sweet tea.   Drink at least 1 glass of water with each meal and aim for at least 8 glasses per day  Exercise at least 150 minutes every week.    Preventive Care 43-17 Years Old, Male Preventive care refers to lifestyle choices and visits with your health care provider that can promote health and wellness. Preventive care visits are also called wellness exams. What can I expect for my preventive care visit? Counseling During your preventive care visit, your health care provider may ask about your: Medical history, including: Past medical problems. Family medical history. Current health, including: Emotional well-being. Home life and relationship well-being. Sexual activity. Lifestyle, including: Alcohol, nicotine or tobacco, and drug use. Access to firearms. Diet, exercise, and sleep habits. Safety issues such as seatbelt and bike helmet use. Sunscreen use. Work and work  Astronomer. Physical exam Your health care provider will check your: Height and weight. These may be used to calculate your BMI (body mass index). BMI is a measurement that tells if you are at a healthy weight. Waist circumference. This measures the distance around your waistline. This measurement also tells if you are at a healthy weight and may help predict your risk of certain diseases, such as type 2 diabetes and high blood pressure. Heart rate and blood pressure. Body temperature. Skin for abnormal spots. What immunizations do I need? Vaccines are usually given at various ages, according to a schedule. Your health care provider will recommend vaccines for you based on your age, medical history, and lifestyle or other factors, such as travel or where you work. What tests do I need? Screening Your health care provider may recommend screening tests for certain conditions. This may include: Lipid and cholesterol levels. Diabetes screening. This is done by checking your blood sugar (glucose) after you have not eaten for a while (fasting). Hepatitis B test. Hepatitis C test. HIV (human immunodeficiency virus) test. STI (sexually transmitted infection) testing, if you are at risk. Lung cancer screening. Prostate cancer screening. Colorectal cancer screening. Talk with your health care provider about your test results, treatment options, and if necessary, the need for more tests. Follow these instructions at home: Eating and drinking  Eat a diet that includes fresh fruits and vegetables, whole grains, lean protein, and low-fat dairy products.  Take vitamin and mineral supplements as recommended by your health care provider. Do not drink alcohol if your health care provider tells you not to drink. If you drink alcohol: Limit how much you have to 0-2 drinks a day. Know how much alcohol is in your drink. In the [place] one drink equals one 12 oz bottle of beer (355 mL), one 5 oz glass of wine  (148 mL), or one 1 oz glass of hard liquor (44 mL). Lifestyle Brush your teeth every morning and night with fluoride toothpaste. Floss one time each day. Exercise for at least 30 minutes 5 or more days each week. Do not use any products that contain nicotine or tobacco. These products include cigarettes, chewing tobacco, and vaping devices, such as e-cigarettes. If you need help quitting, ask your health care provider. Do not use drugs. If you are sexually active, practice safe sex. Use a condom or other form of protection to prevent STIs. Take aspirin only as told by your health care provider. Make sure that you understand how much to take and what form to take. Work with your health care provider to find out whether it is safe and beneficial for you to take aspirin daily. Find healthy ways to manage stress, such as: Meditation, yoga, or listening to music. Journaling. Talking to a trusted person. Spending time with friends and family. Minimize exposure to UV radiation to reduce your risk of skin cancer. Safety Always wear your seat belt while driving or riding in a vehicle. Do not drive: If you have been drinking alcohol. Do not ride with someone who has been drinking. When you are tired or distracted. While texting. If you have been using any mind-altering substances or drugs. Wear a helmet and other protective equipment during sports activities. If you have firearms in your house, make sure you follow all gun safety procedures. What's next? Go to your health care provider once a year for an annual wellness visit. Ask your health care provider how often you should have your eyes and teeth checked. Stay up to date on all vaccines. This information is not intended to replace advice given to you by your health care provider. Make sure you discuss any questions you have with your health care provider. Document Revised: 11/06/2020 Document Reviewed: 11/06/2020 Elsevier Patient Education   2022 [employer].

## 2021-05-30 NOTE — Assessment & Plan Note (Signed)
Stable. Very active with [employer] and has good support.

## 2021-05-30 NOTE — Assessment & Plan Note (Signed)
Continue management per dentistry.

## 2021-05-30 NOTE — Progress Notes (Signed)
Chief Complaint:  Thomas Salinas is a 46 y.o. male who presents today for his annual comprehensive physical exam.    Assessment/Plan:  Chronic Problems Addressed Today: Gout No recent flares.  Continue allopurinol 100 mg daily.  Down syndrome Stable. Very active with [employer] and has good support.   Poor dentition Continue management per dentistry.   Body mass index is 32.89 kg/m. / Obese  BMI Metric Follow Up - 05/30/21 1422       BMI Metric Follow Up-Please document annually   BMI Metric Follow Up Education provided             Preventative Healthcare: Flu shot given today.  Check labs.  Up-to-date on other vaccines.  Patient Counseling(The following topics were reviewed and/or handout was given):  -Nutrition: Stressed importance of moderation in sodium/caffeine intake, saturated fat and cholesterol, caloric balance, sufficient intake of fresh fruits, vegetables, and fiber.  -Stressed the importance of regular exercise.   -Substance Abuse: Discussed cessation/primary prevention of tobacco, alcohol, or other drug use; driving or other dangerous activities under the influence; availability of treatment for abuse.   -Injury prevention: Discussed safety belts, safety helmets, smoke detector, smoking near bedding or upholstery.   -Sexuality: Discussed sexually transmitted diseases, partner selection, use of condoms, avoidance of unintended pregnancy and contraceptive alternatives.   -Dental health: Discussed importance of regular tooth brushing, flossing, and dental visits.  -Health maintenance and immunizations reviewed. Please refer to Health maintenance section.  Return to care in 1 year for next preventative visit.    Subjective:  HPI:  He has no acute complaints today.  See A/P for status of chronic conditions.  Lifestyle Diet: Balanced. Plenty of vegetables.  Exercise: Likes to walk. Member at [employer].   Depression screen PHQ 2/9 05/30/2021  Decreased  Interest 0  Down, Depressed, Hopeless 0  PHQ - 2 Score 0    Health Maintenance Due  Topic Date Due   HIV Screening  Never done   Hepatitis C Screening  Never done   COVID-19 Vaccine (4 - Booster for Pfizer series) 05/31/2020   INFLUENZA VACCINE  12/23/2020     ROS: Per HPI, otherwise a complete review of systems was negative.   PMH:  The following were reviewed and entered/updated in epic: Past Medical History:  Diagnosis Date   Down's syndrome    Fatty liver    Patient Active Problem List   Diagnosis Date Noted   Poor dentition 05/30/2021   Down syndrome 07/28/2012   Gout 07/28/2012   Past Surgical History:  Procedure Laterality Date   CARDIAC SURGERY     av canal repair, mitral valve replacemt   CHOLECYSTECTOMY N/A 09/12/2012   Procedure: LAPAROSCOPIC CHOLECYSTECTOMY;  Surgeon: Axel Filler, MD;  Location: [facility];  Service: General;  Laterality: N/A;   EYE SURGERY  05/26/1979   strabismus   HERNIA REPAIR     MYRINGOTOMY  05/26/1987   left ear   TONSILLECTOMY AND ADENOIDECTOMY      Family History  Problem Relation Age of Onset   Heart disease Father    Hypertension Mother     Medications- reviewed and updated Current Outpatient Medications  Medication Sig Dispense Refill   allopurinol (ZYLOPRIM) 100 MG tablet Take 1 tablet (100 mg total) by mouth daily. 90 tablet 3   naproxen sodium (ANAPROX) 220 MG tablet Take 220 mg by mouth 2 (two) times daily as needed (for pain).     No current facility-administered medications for this  visit.    Allergies-reviewed and updated No Known Allergies  Social History   Socioeconomic History   Marital status: Single    Spouse name: Not on file   Number of children: Not on file   Years of education: Not on file   Highest education level: Not on file  Occupational History   Not on file  Tobacco Use   Smoking status: Never   Smokeless tobacco: Never  Substance and Sexual Activity   Alcohol use: No   Drug use:  No   Sexual activity: Not on file  Other Topics Concern   Not on file  Social History Narrative   Not on file   Social Determinants of Health   Financial Resource Strain: Not on file  Food Insecurity: Not on file  Transportation Needs: Not on file  Physical Activity: Not on file  Stress: Not on file  Social Connections: Not on file        Objective:  Physical Exam: BP 112/72 (BP Location: Left Arm, Patient Position: Sitting, Cuff Size: Normal)    Pulse 68    Temp 98.1 F (36.7 C) (Temporal)    Ht 5' (1.524 m)    Wt 168 lb 6.4 oz (76.4 kg)    SpO2 98%    BMI 32.89 kg/m   Body mass index is 32.89 kg/m. Wt Readings from Last 3 Encounters:  05/30/21 168 lb 6.4 oz (76.4 kg)  01/04/20 168 lb 14.4 oz (76.6 kg)  12/23/19 175 lb (79.4 kg)   Gen: NAD, resting comfortably HEENT: TMs normal bilaterally. OP clear. No thyromegaly noted.  CV: RRR with no murmurs appreciated Pulm: NWOB, CTAB with no crackles, wheezes, or rhonchi GI: Normal bowel sounds present. Soft, Nontender, Nondistended. MSK: no edema, cyanosis, or clubbing noted Skin: warm, dry Neuro: CN2-12 grossly intact. Strength 5/5 in upper and lower extremities. Reflexes symmetric and intact bilaterally.  Psych: Normal affect and thought content     Chaeli Judy M. [name], MD 05/30/2021 2:23 PM

## 2021-05-30 NOTE — Addendum Note (Signed)
Addended by: Scarlett Presto on: 05/30/2021 02:30 PM   Modules accepted: Orders

## 2021-06-02 ENCOUNTER — Encounter: Payer: Self-pay | Admitting: *Deleted

## 2021-06-02 ENCOUNTER — Encounter: Payer: Self-pay | Admitting: Family Medicine

## 2021-06-02 DIAGNOSIS — E785 Hyperlipidemia, unspecified: Secondary | ICD-10-CM | POA: Insufficient documentation

## 2021-06-02 DIAGNOSIS — R739 Hyperglycemia, unspecified: Secondary | ICD-10-CM | POA: Insufficient documentation

## 2021-06-02 NOTE — Progress Notes (Signed)
Please inform patient and his mother of the following:  Cholesterol and blood sugar are borderline needed.  Everything else stable.  Do not need to start meds.  He should continue working on diet and exercise and we can recheck uteroscope.

## 2021-06-10 ENCOUNTER — Encounter: Payer: Self-pay | Admitting: Family Medicine

## 2021-06-26 ENCOUNTER — Telehealth: Payer: Self-pay

## 2021-06-26 NOTE — Telephone Encounter (Signed)
See note

## 2021-06-26 NOTE — Telephone Encounter (Signed)
Ok to write letter stating medical necessity for dental procedure.  Thomas Salinas. [name], MD 06/26/2021 1:49 PM

## 2021-06-26 NOTE — Telephone Encounter (Signed)
Patients mother called in and stated that patient is trying to get Dental procedure done at [facility] but they are waiting on Medicaid to approve it. Patients mother wants to see if Dr. [name] would write a letter to medicaid to say it in medical necessary for him to get 4 implants for his front teeth and to get the 6 crowns on the bottom so he will be able to eat since the patient has grounded his teeth so much. Patient mother states the patient gums are bleeding every time he eats since he doesn't have any teeth.

## 2021-06-27 ENCOUNTER — Encounter: Payer: Self-pay | Admitting: Family Medicine

## 2021-06-27 NOTE — Telephone Encounter (Signed)
Letter done printed and placed in front office  Call Patient care giver to informed letter ready to be pick up

## 2021-06-30 ENCOUNTER — Other Ambulatory Visit: Payer: Self-pay

## 2021-06-30 ENCOUNTER — Emergency Department (HOSPITAL_COMMUNITY)
Admission: EM | Admit: 2021-06-30 | Discharge: 2021-06-30 | Disposition: A | Discharge status: Home / Self Care | Payer: Medicare Other | Attending: Emergency Medicine | Admitting: Emergency Medicine

## 2021-06-30 ENCOUNTER — Encounter (HOSPITAL_COMMUNITY): Payer: Self-pay

## 2021-06-30 DIAGNOSIS — R531 Weakness: Secondary | ICD-10-CM | POA: Insufficient documentation

## 2021-06-30 DIAGNOSIS — R9431 Abnormal electrocardiogram [ECG] [EKG]: Secondary | ICD-10-CM | POA: Diagnosis not present

## 2021-06-30 DIAGNOSIS — R55 Syncope and collapse: Secondary | ICD-10-CM | POA: Insufficient documentation

## 2021-06-30 DIAGNOSIS — R42 Dizziness and giddiness: Secondary | ICD-10-CM

## 2021-06-30 LAB — BASIC METABOLIC PANEL
Anion gap: 8 (ref 5–15)
BUN: 14 mg/dL (ref 6–20)
CO2: 24 mmol/L (ref 22–32)
Calcium: 8.6 mg/dL — ABNORMAL LOW (ref 8.9–10.3)
Chloride: 103 mmol/L (ref 98–111)
Creatinine, Ser: 0.8 mg/dL (ref 0.61–1.24)
GFR, Estimated: >60 mL/min (ref >60)
Glucose, Bld: 107 mg/dL — ABNORMAL HIGH (ref 70–99)
Potassium: 3.7 mmol/L (ref 3.5–5.1)
Sodium: 135 mmol/L (ref 135–145)

## 2021-06-30 LAB — CBG MONITORING, ED: Glucose-Capillary: 163 mg/dL — ABNORMAL HIGH (ref 70–99)

## 2021-06-30 LAB — CBC
HCT: 49.6 % (ref 39.0–52.0)
Hemoglobin: 17.8 g/dL — ABNORMAL HIGH (ref 13.0–17.0)
MCH: 33.6 pg (ref 26.0–34.0)
MCHC: 35.9 g/dL (ref 30.0–36.0)
MCV: 93.6 fL (ref 80.0–100.0)
Platelets: 160 10*3/uL (ref 150–400)
RBC: 5.3 MIL/uL (ref 4.22–5.81)
RDW: 13.2 % (ref 11.5–15.5)
WBC: 5.3 10*3/uL (ref 4.0–10.5)
nRBC: 0 % (ref 0.0–0.2)

## 2021-06-30 LAB — HEPATIC FUNCTION PANEL
ALT: 31 U/L (ref 0–44)
AST: 29 U/L (ref 15–41)
Albumin: 3.8 g/dL (ref 3.5–5.0)
Alkaline Phosphatase: 48 U/L (ref 38–126)
Bilirubin, Direct: 0.1 mg/dL (ref 0.0–0.2)
Indirect Bilirubin: 0.5 mg/dL (ref 0.3–0.9)
Total Bilirubin: 0.6 mg/dL (ref 0.3–1.2)
Total Protein: 7 g/dL (ref 6.5–8.1)

## 2021-06-30 LAB — DIFFERENTIAL
Abs Immature Granulocytes: 0.02 10*3/uL (ref 0.00–0.07)
Basophils Absolute: 0.1 10*3/uL (ref 0.0–0.1)
Basophils Relative: 1 %
Eosinophils Absolute: 0 10*3/uL (ref 0.0–0.5)
Eosinophils Relative: 0 %
Immature Granulocytes: 0 %
Lymphocytes Relative: 18 %
Lymphs Abs: 0.9 10*3/uL (ref 0.7–4.0)
Monocytes Absolute: 0.6 10*3/uL (ref 0.1–1.0)
Monocytes Relative: 12 %
Neutro Abs: 3.6 10*3/uL (ref 1.7–7.7)
Neutrophils Relative %: 69 %

## 2021-06-30 NOTE — ED Provider Triage Note (Signed)
Emergency Medicine Provider Triage Evaluation Note  Thomas Salinas , a 46 y.o. male  was evaluated in triage.  Pt complains of syncopal episode.  Patient has a history of Down syndrome, works in her scooter.  Reports she was working, felt dizzy and lightheaded.  He was found collapsed by a coworker who called an ambulance.  Patient reports feeling dizzy and lightheaded, denies any chest pain or shortness of breath.  Denies any previous episode of this.  Not requisites morning, no other changes..  Review of Systems  Positive: SYNCOPE Negative: ABOVE  Physical Exam  BP 115/78 (BP Location: Left Arm)    Pulse 95    Temp 98 F (36.7 C) (Oral)    Resp 16    Ht 5' (1.524 m)    Wt 75.8 kg    SpO2 97%    BMI 32.61 kg/m  Gen:   Awake, no distress   Resp:  Normal effort  MSK:   Moves extremities without difficulty  Other:  Follows commands, able to move upper and lower extremities without any difficulty.  Labs, EKG  Medical Decision Making  Medically screening exam initiated at 12:13 PM.  Appropriate orders placed.  Thomas Salinas was informed that the remainder of the evaluation will be completed by another provider, this initial triage assessment does not replace that evaluation, and the importance of remaining in the ED until their evaluation is complete.     Thomas Arista, PA-C 06/30/21 1214

## 2021-06-30 NOTE — ED Triage Notes (Addendum)
Per EMS- patient was picked up at [employer] where he works. 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 Patient denies any chest pain, SOB, or N/V.  Patient 's mother reports that his employer called her husband and stated that they found the patient on the floor inside the store. Patient not sure if he hit his head. Patient is not on any blood thinners. Patient denies any n/V or blurred vision.

## 2021-06-30 NOTE — Discharge Instructions (Signed)
Return for any problem.  ?

## 2021-06-30 NOTE — ED Provider Notes (Signed)
Thomas Salinas Provider Note   CSN: [id number] Arrival date & time: 06/30/21  1125     History  Chief Complaint  Patient presents with   Weakness   Dizziness   Loss of Consciousness    Thomas Salinas is a 46 y.o. male.  46 year old male with prior medical history as detailed below presents for evaluation.  Patient was at work today when he dizzy and lightheaded.  He did not pass out.  Patient reports he was already at work.  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  He denies associated chest pain or shortness of breath.  Patient reports he feels significantly better now.  Patient admits that last night he did not sleep a lot because he was thinking about "werewolves."  The patient likes [employer] video.  Per the patient's father, the patient likes to watch horror movies.  The patient also admits that this morning he missed breakfast because he was almost late for his ride.  He reports that he is hungry now.  He denies nausea.  He denies urinary symptoms.  He denies recent bowel movement change.    The history is provided by the patient and medical records.  Weakness Severity:  Mild Onset quality:  Gradual Duration:  15 minutes Timing:  Rare Progression:  Resolved Chronicity:  New Associated symptoms: dizziness and syncope   Dizziness Associated symptoms: syncope and weakness   Loss of Consciousness Associated symptoms: dizziness and weakness       Home Medications Prior to Admission medications   Medication Sig Start Date End Date Taking? Authorizing Provider  allopurinol (ZYLOPRIM) 100 MG tablet Take 1 tablet (100 mg total) by mouth daily. 05/30/21   Vivi Barrack, MD  naproxen sodium (ANAPROX) 220 MG tablet Take 220 mg by mouth 2 (two) times daily as needed (for pain).    [provider]      Allergies    Patient has no known allergies.    Review of Systems    Review of Systems  Cardiovascular:  Positive for syncope.  Neurological:  Positive for dizziness and weakness.  All other systems reviewed and are negative.  Physical Exam Updated Vital Signs BP 106/71    Pulse 77    Temp 98 F (36.7 C) (Oral)    Resp 17    Ht 5' (1.524 m)    Wt 75.8 kg    SpO2 100%    BMI 32.61 kg/m  Physical Exam Vitals and nursing note reviewed.  Constitutional:      General: He is not in acute distress.    Appearance: Normal appearance. He is well-developed.  HENT:     Head: Normocephalic and atraumatic.  Eyes:     Conjunctiva/sclera: Conjunctivae normal.     Pupils: Pupils are equal, round, and reactive to light.  Cardiovascular:     Rate and Rhythm: Normal rate and regular rhythm.     Heart sounds: Normal heart sounds.  Pulmonary:     Effort: Pulmonary effort is normal. No respiratory distress.     Breath sounds: Normal breath sounds.  Abdominal:     General: There is no distension.     Palpations: Abdomen is soft.     Tenderness: There is no abdominal tenderness.  Musculoskeletal:        General: No deformity. Normal range of motion.     Cervical back: Normal range of motion and neck  supple.  Skin:    General: Skin is warm and dry.  Neurological:     General: No focal deficit present.     Mental Status: He is alert and oriented to person, place, and time. Mental status is at baseline.    ED Results / Procedures / Treatments   Labs (all labs ordered are listed, but only abnormal results are displayed) Labs Reviewed  BASIC METABOLIC PANEL - Abnormal; Notable for the following components:      Result Value   Glucose, Bld 107 (*)    Calcium 8.6 (*)    All other components within normal limits  CBC - Abnormal; Notable for the following components:   Hemoglobin 17.8 (*)    All other components within normal limits  CBG MONITORING, ED - Abnormal; Notable for the following components:   Glucose-Capillary 163 (*)    All other components within  normal limits  HEPATIC FUNCTION PANEL  DIFFERENTIAL  CBG MONITORING, ED    EKG EKG Interpretation  Date/Time:  Monday June 30 2021 11:37:29 EST Ventricular Rate:  95 PR Interval:  207 QRS Duration: 124 QT Interval:  359 QTC Calculation: 452 R Axis:   -64 Text Interpretation: Sinus rhythm Prolonged PR interval RBBB and LAFB ST elev, probable normal early repol pattern Confirmed by Dene Gentry [phone]) on 06/30/2021 2:30:47 PM  Radiology No results found.  Procedures Procedures    Medications Ordered in ED Medications - No data to display  ED Course/ Medical Decision Making/ A&P                           Medical Decision Making Amount and/or Complexity of Data Reviewed Labs: ordered.    Medical Screen Complete  This patient presented to the ED with complaint of dizziness.  This complaint involves an extensive number of treatment options. The initial differential diagnosis includes, but is not limited to, dehydration, metabolic abnormality,etc  This presentation is: Acute, Self-Limited, Previously Undiagnosed, Uncertain Prognosis, and Complicated  Patient is presenting with reported dizziness that occurred while at work.  Patient admits that he had little sleep and skipped breakfast this morning.  Patient feels significantly better at time of my evaluation  Screening labs obtained are without significant abnormality.  Patient desires DC home.  He and his father who is at bedside understand need for close follow-up.  Strict return precautions given understood.  Co morbidities that complicated the patient's evaluation  Down syndrome   Additional history obtained:  Additional history obtained from [facility] External records from outside sources obtained and reviewed including prior ED visits and prior Inpatient records.    Lab Tests:  I ordered and personally interpreted labs.  The pertinent results include: CBC, BMP, Hepatic Function Panel    Cardiac  Monitoring:  The patient was maintained on a cardiac monitor.  I personally viewed and interpreted the cardiac monitor which showed an underlying rhythm of: NSR  Problem List / ED Course:  Dizziness   Reevaluation:  After the interventions noted above, I reevaluated the patient and found that they have: improved   Disposition:  After consideration of the diagnostic results and the patients response to treatment, I feel that the patent would benefit from close outpatient follow-up.          Final Clinical Impression(s) / ED Diagnoses Final diagnoses:  Dizziness    Rx / DC Orders ED Discharge Orders     None  Valarie Merino, MD 06/30/21 [phone]

## 2021-09-07 IMAGING — CT CT ABD-PELV W/ CM
2 of 5 series · 15 of 46 positions shown, 17 images · IV contrast (omnipaque)
Comparison: Unenhanced CT abdomen and pelvis 03/27/2012.

CLINICAL DATA: Two day history of severe LEFT flank pain (rated
[DATE] by the patient).

EXAM:
CT ABDOMEN AND PELVIS WITH CONTRAST
TECHNIQUE: Multidetector CT imaging of the abdomen and pelvis was performed
using the standard protocol following bolus administration of
intravenous contrast.
CONTRAST:  100mL OMNIPAQUE IOHEXOL 300 MG/ML IV.

[Series 2: axial st · axial · 0.82mm/px · z∈[-477,-67]mm · 12 of 96 slices shown, 14 images]
[im 7/96  soft-tissue]
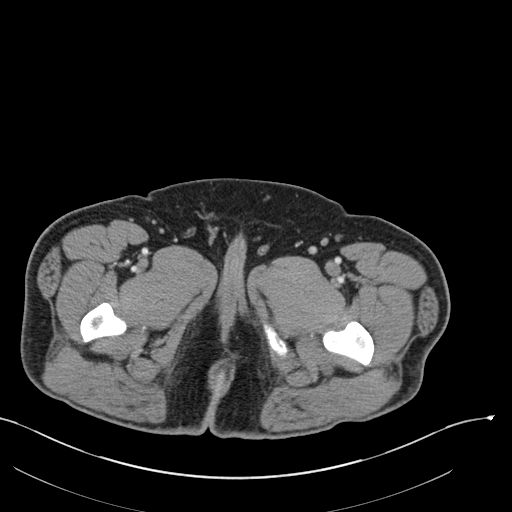
[im 7/96  bone]
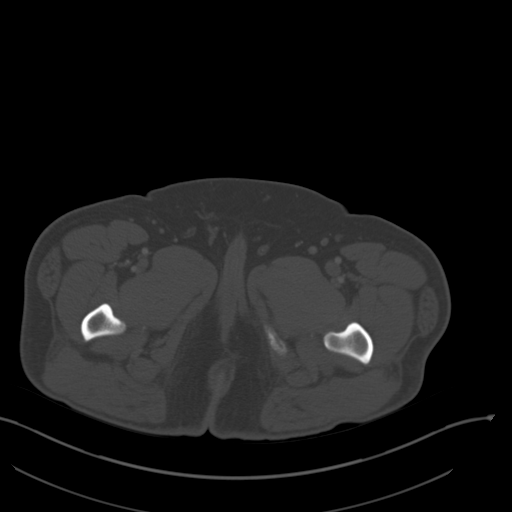
[im 14/96  soft-tissue]
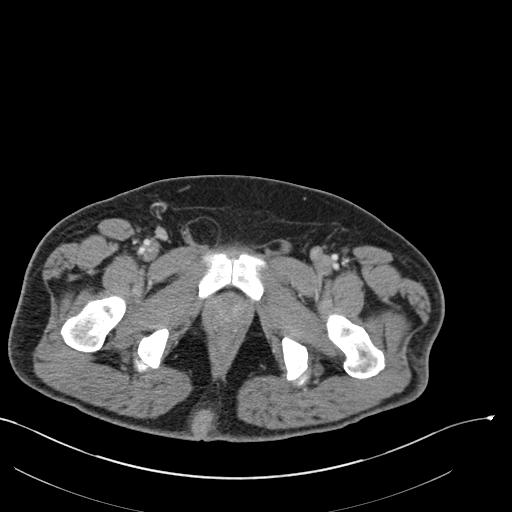
[im 21/96  soft-tissue]
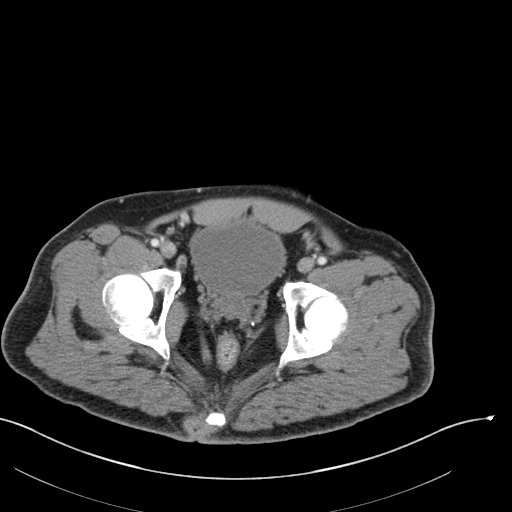
[im 28/96  soft-tissue]
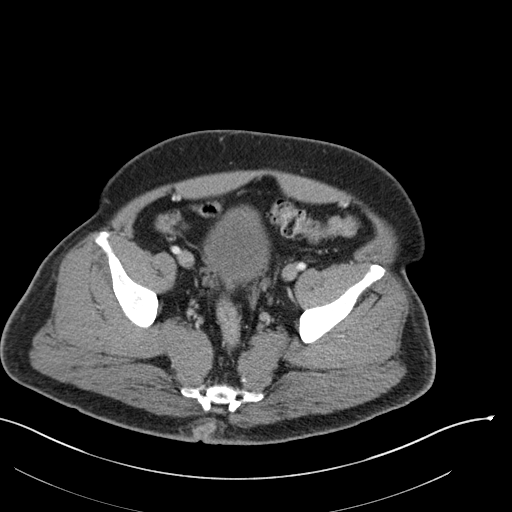
[im 34/96  soft-tissue]
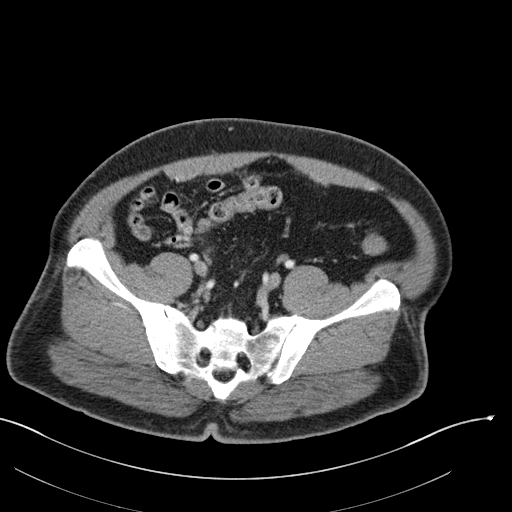
[im 41/96  soft-tissue]
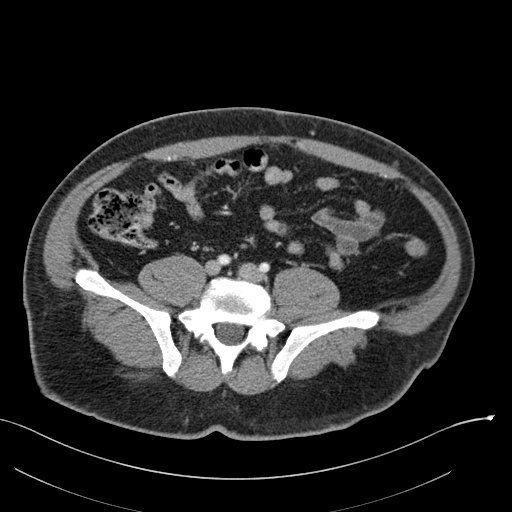
[im 55/96  soft-tissue]
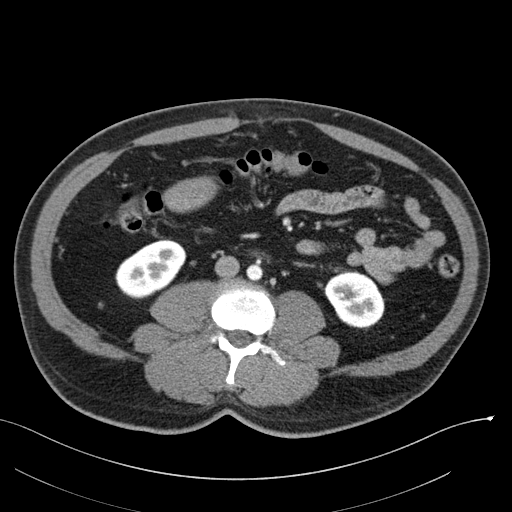
[im 62/96  soft-tissue]
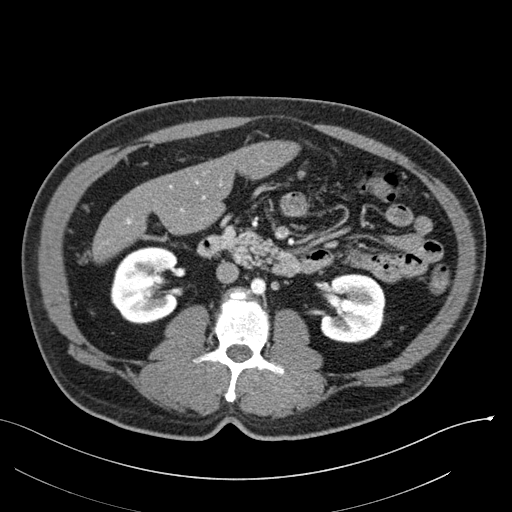
[im 68/96  soft-tissue]
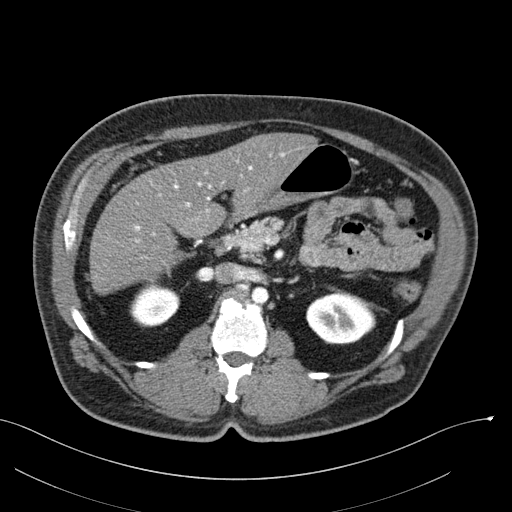
[im 68/96  bone]
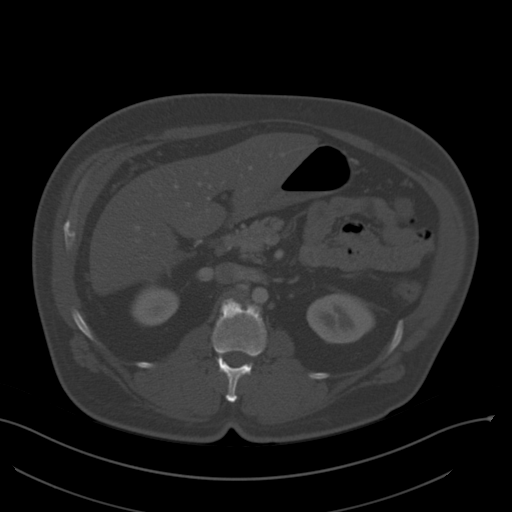
[im 75/96  soft-tissue]
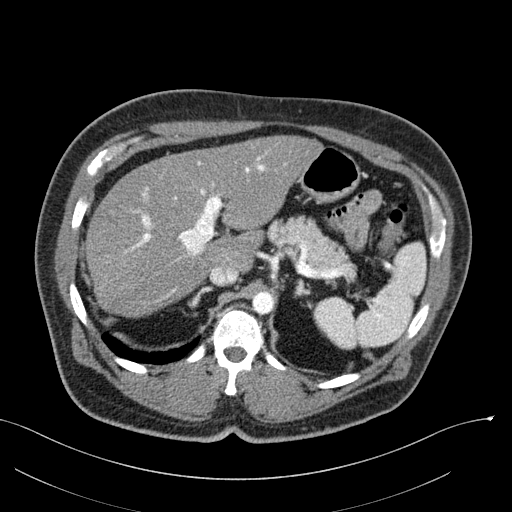
[im 82/96  soft-tissue]
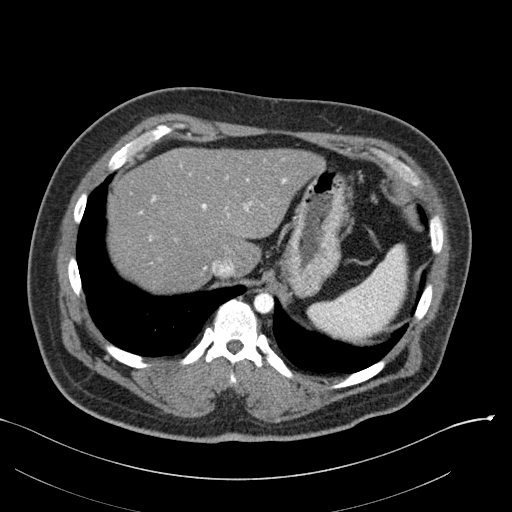
[im 89/96  soft-tissue]
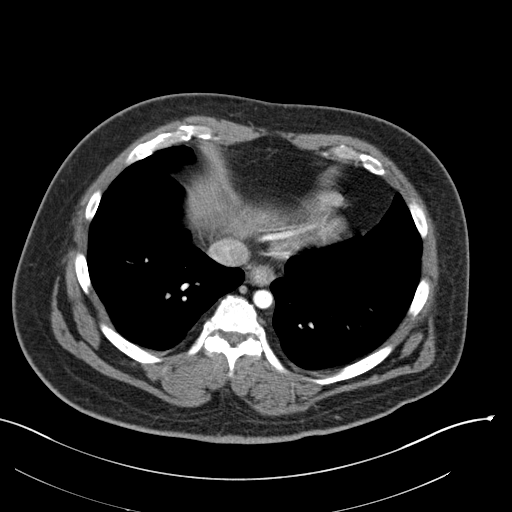

[Series 4: coronal st · coronal · 0.91mm/px · 3 of 139 slices shown]
[im 47/139  soft-tissue]
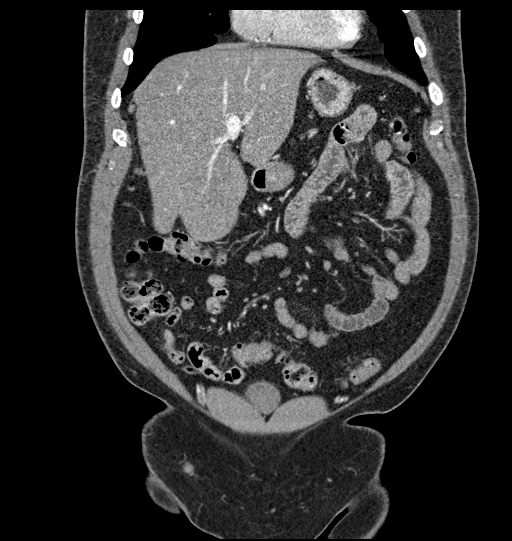
[im 62/139  soft-tissue]
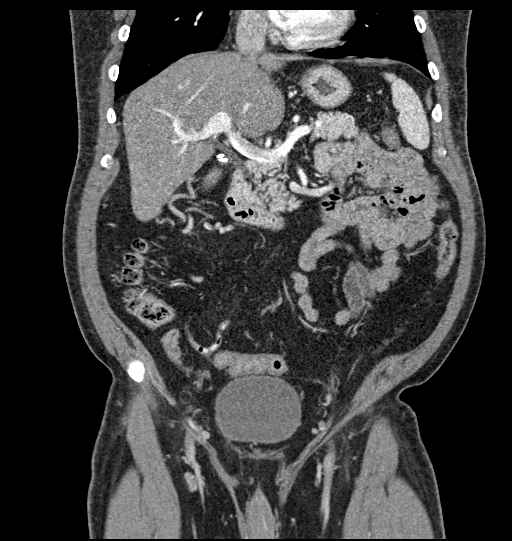
[im 77/139  soft-tissue]
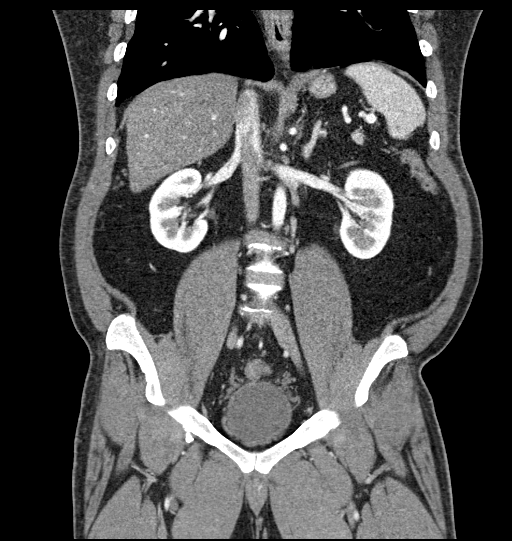

[15 of 46 positions shown; findings below may reference images not displayed]

FINDINGS: Lower chest: Scattered very small peripheral ground-glass opacities
involving the visualized lower lobes. Scarring in the RIGHT MIDDLE
LOBE and lingula. Visualized lung bases otherwise clear.

Hepatobiliary: Diffuse hepatic steatosis without focal hepatic
parenchymal abnormalities. Relative enlargement of the LEFT lobe and
caudate lobe when compared to the RIGHT lobe. Surgically absent
gallbladder. No biliary ductal dilation.

Pancreas: Normal in appearance without evidence of mass, ductal
dilation, or inflammation.

Spleen: Normal in size. Approximate 1.2 cm solitary low-attenuation
lesion in the posterior spleen just above the hilum, not conspicuous
on the prior unenhanced study.

Adrenals/Urinary Tract: Normal appearing adrenal glands. Kidneys
normal in size and appearance without focal parenchymal abnormality.
No hydronephrosis. No evidence of urinary tract calculi. Normal
appearing urinary bladder.

Stomach/Bowel: Stomach normal in appearance for the degree of
distention. Normal-appearing small bowel. Entire colon relatively
decompressed with expected stool burden. High attenuation material
throughout the normal appearing appendix which may represent
multiple appendicoliths.

Vascular/Lymphatic: No visible aorto-iliofemoral atherosclerosis.
Incompletely opacified or filling defect involving the portal vein.

No pathologic lymphadenopathy.

Reproductive: Prostate gland and seminal vesicles normal in size and
appearance for age.

Other: Umbilical hernia and midline supraumbilical abdominal wall
hernia, both containing fat. RIGHT inguinal hernia containing fat.

Musculoskeletal: Degenerative disc disease and spondylosis with
ANTERIOR osteophytes at L1-2 and L2-3. No acute findings.
IMPRESSION: 1. Incompletely opacified portal vein or filling defect involving
the portal vein. Doppler ultrasound evaluation of the portal vein is
recommended to determine if there is thrombus or if the finding is
related to mixing of unopacified blood.
2. Diffuse hepatic steatosis without focal hepatic parenchymal
abnormalities. Morphologic findings of the liver which are highly
suggestive of hepatic cirrhosis.
3. Approximate 1.2 cm solitary low-attenuation lesion in the
posterior spleen, not conspicuous on the prior unenhanced study,
likely a benign cyst or hemangioma.
4. Umbilical hernia and midline supraumbilical abdominal wall
hernia, both containing fat.
5. RIGHT inguinal hernia containing fat.
6. Scattered very small peripheral ground-glass opacities involving
the visualized lower lobes, likely inflammatory or postinflammatory.

## 2021-10-03 ENCOUNTER — Ambulatory Visit (INDEPENDENT_AMBULATORY_CARE_PROVIDER_SITE_OTHER): Payer: Medicare Other | Admitting: Family Medicine

## 2021-10-03 VITALS — BP 123/74 | HR 76 | Temp 97.0°F | Ht 60.0 in | Wt 165.4 lb

## 2021-10-03 DIAGNOSIS — R490 Dysphonia: Secondary | ICD-10-CM

## 2021-10-03 MED ORDER — AZELASTINE HCL 0.1 % NA SOLN: 2.0000 | Freq: Two times a day (BID) | NASAL | 12 refills | Status: DC

## 2021-10-03 NOTE — Patient Instructions (Signed)
It was very nice to see you today! ? ?Please start the nasal spray.  This should help with the hoarseness.  Let me know if not improving in the next 1 to 2 weeks. ? ?Take care, ?Dr Jerline Pain ? ?PLEASE NOTE: ? ?If you had any lab tests please let us know if you have not heard back within a few days. You may see your results on mychart before we have a chance to review them but we will give you a call once they are reviewed by [place]. If we ordered any referrals today, please let us know if you have not heard from their office within the next week.  ? ?Please try these tips to maintain a healthy lifestyle: ? ?Eat at least 3 REAL meals and 1-2 snacks per day.  Aim for no more than 5 hours between eating.  If you eat breakfast, please do so within one hour of getting up.  ? ?Each meal should contain half fruits/vegetables, one quarter protein, and one quarter carbs (no bigger than a computer mouse) ? ?Cut down on sweet beverages. This includes juice, soda, and sweet tea.  ? ?Drink at least 1 glass of water with each meal and aim for at least 8 glasses per day ? ?Exercise at least 150 minutes every week.   ?

## 2021-10-03 NOTE — Progress Notes (Signed)
? ?  Thomas Salinas is a 46 y.o. male who presents today for an office visit. ? ?Assessment/Plan:  ?Hoarseness  ?Likely secondary to postnasal drip based on exam findings.  No heartburn or reflux symptoms.  We discussed referral to ENT given his length of symptoms versus trial of Astelin.  Patient and mother elected for trial of Astelin first.  They will let me know if not improving in a couple weeks and we can refer to ENT at that point. ? ? ?  ?Subjective:  ?HPI: ? ?Patient here with hoarse voice for the last few months. No rhinorrhea. No sore throat. Staying about the same.  No treatments tried. No hearburn or reflux.  ? ?   ?  ?Objective:  ?Physical Exam: ?BP 123/74 (BP Location: Right Arm)   Pulse 76   Temp (!) 97 ?F (36.1 ?C) (Temporal)   Ht 5' (1.524 m)   Wt 165 lb 6.4 oz (75 kg)   SpO2 95%   BMI 32.30 kg/m?   ?Gen: No acute distress, resting comfortably ?HEENT: TMs with clear effusion.  OP slightly erythematous.  Nasal mucosa with thick green discharge. ?CV: Regular rate and rhythm with no murmurs appreciated ?Pulm: Normal work of breathing, clear to auscultation bilaterally with no crackles, wheezes, or rhonchi ?Neuro: Grossly normal, moves all extremities ?Psych: Normal affect and thought content ? ?   ? ?Algis Greenhouse. Jerline Pain, MD ?10/03/2021 2:36 PM  ?

## 2021-10-07 ENCOUNTER — Telehealth: Payer: Medicare Other | Admitting: Physician Assistant

## 2021-10-07 DIAGNOSIS — M109 Gout, unspecified: Secondary | ICD-10-CM | POA: Diagnosis not present

## 2021-10-07 MED ORDER — COLCHICINE 0.6 MG PO TABS: ORAL_TABLET | ORAL | 0 refills | Status: AC

## 2021-10-07 NOTE — Progress Notes (Signed)
?Virtual Visit Consent  ? ?Thomas Salinas, you are scheduled for a virtual visit with a [facility] provider today. Just as with appointments in the office, your consent must be obtained to participate. Your consent will be active for this visit and any virtual visit you may have with one of our providers in the next 365 days. If you have a MyChart account, a copy of this consent can be sent to you electronically. ? ?As this is a virtual visit, video technology does not allow for your provider to perform a traditional examination. This may limit your provider's ability to fully assess your condition. If your provider identifies any concerns that need to be evaluated in person or the need to arrange testing (such as labs, EKG, etc.), we will make arrangements to do so. Although advances in technology are sophisticated, we cannot ensure that it will always work on either your end or our end. If the connection with a video visit is poor, the visit may have to be switched to a telephone visit. With either a video or telephone visit, we are not always able to ensure that we have a secure connection. ? ?By engaging in this virtual visit, you consent to the provision of healthcare and authorize for your insurance to be billed (if applicable) for the services provided during this visit. Depending on your insurance coverage, you may receive a charge related to this service. ? ?I need to obtain your verbal consent now. Are you willing to proceed with your visit today? Thomas Salinas  and his father , guardian, have provided verbal consent on 10/07/2021 for a virtual visit (video or telephone). Piedad Climes, PA-C ? ?Date: 10/07/2021 6:54 PM ? ?Virtual Visit via Video Note  ? ?IPiedad Climes, connected with  Thomas Salinas  ([id number], [id number]) on 10/07/21 at  6:15 PM EDT by a video-enabled telemedicine application and verified that I am speaking with the correct person using two  identifiers. ? ?Location: ?Patient: Virtual Visit Location Patient: Home ?Provider: Virtual Visit Location Provider: Home Office ?  ?I discussed the limitations of evaluation and management by telemedicine and the availability of in person appointments. The patient expressed understanding and agreed to proceed.   ? ?History of Present Illness: ?Thomas Salinas is a 46 y.o. who identifies as a male who was assigned male at birth, and is being seen today with his mother and father (guardians) for gout flare up. Notes being out of his allopurinol for a few days as the medication is on backorder with the pharmacy. Hoping to get within a couple of days. Notes today waking up with pain in MTP of L great toe with tenderness and warmth. Is still able to wiggle the toe and bare weight. Denies any alcohol or NSAID use. Diet is not the best at present. 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 Denies any fever, chills or aches.   ? ?HPI: HPI  ?Problems:  ?Patient Active Problem List  ? Diagnosis Date Noted  ? Dyslipidemia 06/02/2021  ? Hyperglycemia 06/02/2021  ? Poor dentition 05/30/2021  ? Down syndrome 07/28/2012  ? Gout 07/28/2012  ?  ?Allergies: No Known Allergies ?Medications:  ?Current Outpatient Medications:  ?  colchicine 0.6 MG tablet, Today: Take 2 tablet by mouth once. Take an additional tablet 1 hour later. Day 2 on: Take 1 tab PO BID for up to 7 days., Disp: 20 tablet, Rfl:  0 ?  allopurinol (ZYLOPRIM) 100 MG tablet, Take 1 tablet (100 mg total) by mouth daily., Disp: 90 tablet, Rfl: 3 ?  azelastine (ASTELIN) 0.1 % nasal spray, Place 2 sprays into both nostrils 2 (two) times daily., Disp: 30 mL, Rfl: 12 ?  naproxen sodium (ANAPROX) 220 MG tablet, Take 220 mg by mouth 2 (two) times daily as needed (for pain). (Patient not taking: Reported on 10/03/2021), Disp: , Rfl:  ? ?Observations/Objective: ?Patient is well-developed, well-nourished in no acute distress.  ?Resting  comfortably at home.  ?Head is normocephalic, atraumatic.  ?No labored breathing. ?Speech is clear and coherent with logical content.  ?Patient is alert and oriented at baseline.  ? ?Assessment and Plan: ?1. Podagra ?- colchicine 0.6 MG tablet; Today: Take 2 tablet by mouth once. Take an additional tablet 1 hour later. Day 2 on: Take 1 tab PO BID for up to 7 days.  Dispense: 20 tablet; Refill: 0 ? ?History of frequent gout, typically controlled with allopurinol but is out due to backorder. Will start Colchicine 0.6 mg -- taking 1.2 mg tonight, followed by another tablet an hour later. Tomorrow will start 1 tab BID to help speed resolution. They are to follow-up with PCP about restarting allopurinol as they may want him to hold off or have them go ahead and start with a colchicine bridge.  ? ?Follow Up Instructions: ?I discussed the assessment and treatment plan with the patient. The patient was provided an opportunity to ask questions and all were answered. The patient agreed with the plan and demonstrated an understanding of the instructions.  A copy of instructions were sent to the patient via MyChart unless otherwise noted below.  ? ?The patient was advised to call back or seek an in-person evaluation if the symptoms worsen or if the condition fails to improve as anticipated. ? ?Time:  ?I spent 10 minutes with the patient via telehealth technology discussing the above problems/concerns.   ? ?Piedad Climes, PA-C ?

## 2021-10-07 NOTE — Patient Instructions (Signed)
?  Karel Jarvis, thank you for joining Leeanne Rio, PA-C for today's virtual visit.  While this provider is not your primary care provider (PCP), if your PCP is located in our provider database this encounter information will be shared with them immediately following your visit. ? ?Consent: ?(Patient) Thomas Salinas provided verbal consent for this virtual visit at the beginning of the encounter. ? ?Current Medications: ? ?Current Outpatient Medications:  ?  allopurinol (ZYLOPRIM) 100 MG tablet, Take 1 tablet (100 mg total) by mouth daily., Disp: 90 tablet, Rfl: 3 ?  azelastine (ASTELIN) 0.1 % nasal spray, Place 2 sprays into both nostrils 2 (two) times daily., Disp: 30 mL, Rfl: 12 ?  naproxen sodium (ANAPROX) 220 MG tablet, Take 220 mg by mouth 2 (two) times daily as needed (for pain). (Patient not taking: Reported on 10/03/2021), Disp: , Rfl:   ? ?Medications ordered in this encounter:  ?No orders of the defined types were placed in this encounter. ?  ? ?*If you need refills on other medications prior to your next appointment, please contact your pharmacy* ? ?Follow-Up: ?Call back or seek an in-person evaluation if the symptoms worsen or if the condition fails to improve as anticipated. ? ?Other Instructions ? ? ?If you have been instructed to have an in-person evaluation today at a local Urgent Care facility, please use the link below. It will take you to a list of all of our available [facility], including address, phone number and hours of operation. Please do not delay care.  ?[facility] ? ?If you or a family member do not have a primary care provider, use the link below to schedule a visit and establish care. When you choose a [facility] primary care physician or advanced practice provider, you gain a long-term partner in health. ?Find a Primary Care Provider ? ?Learn more about [facility] in-office and virtual care options: ?[facility]  ?

## 2021-12-19 ENCOUNTER — Ambulatory Visit: Payer: Medicare Other | Admitting: Family Medicine

## 2021-12-25 ENCOUNTER — Ambulatory Visit (INDEPENDENT_AMBULATORY_CARE_PROVIDER_SITE_OTHER): Payer: Medicare Other | Admitting: Family Medicine

## 2021-12-25 ENCOUNTER — Encounter: Payer: Self-pay | Admitting: Family Medicine

## 2021-12-25 VITALS — BP 109/75 | HR 64 | Temp 97.4°F | Ht 60.0 in | Wt 169.4 lb

## 2021-12-25 DIAGNOSIS — K439 Ventral hernia without obstruction or gangrene: Secondary | ICD-10-CM | POA: Diagnosis not present

## 2021-12-25 DIAGNOSIS — M109 Gout, unspecified: Secondary | ICD-10-CM

## 2021-12-25 DIAGNOSIS — R490 Dysphonia: Secondary | ICD-10-CM | POA: Diagnosis not present

## 2021-12-25 DIAGNOSIS — R739 Hyperglycemia, unspecified: Secondary | ICD-10-CM | POA: Diagnosis not present

## 2021-12-25 LAB — POCT GLYCOSYLATED HEMOGLOBIN (HGB A1C): Hemoglobin A1C: 5.8 % — AB (ref 4.0–5.6)

## 2021-12-25 NOTE — Progress Notes (Signed)
   Thomas Salinas is a 46 y.o. male who presents today for an office visit.  Assessment/Plan:  New/Acute Problems: Hoarseness Has not had much improvement with Astelin.  Given persistence of symptoms over the last several months will refer to ENT for further evaluation.  Chronic Problems Addressed Today: Ventral hernia No red flags.  Likely related to previous abdominal surgeries.  We discussed referral to surgery for further evaluation however deferred for now.  Hyperglycemia A1c stable at 5.8 today.  Continue lifestyle modifications and we can recheck again in 6 months.  Gout No recent flares.  Continue allopurinol 100 mg daily.     Subjective:  HPI:  Patient here for follow-up.  We last saw him about 3 months ago.  At that time he was having issues with hoarse voice for a few months.  There was concern for postnasal drip.  Was not having heartburn or reflux symptoms.  We tried a course of Astelin.       Objective:  Physical Exam: BP 109/75   Pulse 64   Temp (!) 97.4 F (36.3 C) (Temporal)   Ht 5' (1.524 m)   Wt 169 lb 6.4 oz (76.8 kg)   SpO2 95%   BMI 33.08 kg/m   Gen: No acute distress, resting comfortably GI: Small easily reducible ventral hernia noted Neuro: Grossly normal, moves all extremities Psych: Normal affect and thought content      Thomas Salinas M. [name], MD 12/25/2021 2:16 PM

## 2021-12-25 NOTE — Assessment & Plan Note (Signed)
No recent flares.  Continue allopurinol 100 mg daily. 

## 2021-12-25 NOTE — Patient Instructions (Signed)
It was very nice to see you today!  We will refer you to see ear nose and throat doctor.  Please let me know if your hernia worsens.  Your blood sugar today is 5.8.  This is just a little elevated.  Please continue to work on diet and exercise.  We will see back in 6 months for your annual checkup and we can recheck at that time.  Take care, Dr [name]  PLEASE NOTE:  If you had any lab tests please let us know if you have not heard back within a few days. You may see your results on mychart before we have a chance to review them but we will give you a call once they are reviewed by [place]. If we ordered any referrals today, please let us know if you have not heard from their office within the next week.   Please try these tips to maintain a healthy lifestyle:  Eat at least 3 REAL meals and 1-2 snacks per day.  Aim for no more than 5 hours between eating.  If you eat breakfast, please do so within one hour of getting up.   Each meal should contain half fruits/vegetables, one quarter protein, and one quarter carbs (no bigger than a computer mouse)  Cut down on sweet beverages. This includes juice, soda, and sweet tea.   Drink at least 1 glass of water with each meal and aim for at least 8 glasses per day  Exercise at least 150 minutes every week.

## 2021-12-25 NOTE — Assessment & Plan Note (Signed)
No red flags.  Likely related to previous abdominal surgeries.  We discussed referral to surgery for further evaluation however deferred for now.

## 2021-12-25 NOTE — Assessment & Plan Note (Signed)
A1c stable at 5.8 today.  Continue lifestyle modifications and we can recheck again in 6 months.

## 2022-03-12 DIAGNOSIS — R490 Dysphonia: Secondary | ICD-10-CM | POA: Diagnosis not present

## 2022-03-12 DIAGNOSIS — K219 Gastro-esophageal reflux disease without esophagitis: Secondary | ICD-10-CM | POA: Diagnosis not present

## 2022-03-19 ENCOUNTER — Telehealth: Payer: Self-pay | Admitting: Family Medicine

## 2022-03-19 NOTE — Telephone Encounter (Signed)
Copied from [facility] [phone]. Topic: Medicare AWV >> Mar 19, 2022  9:36 AM Gillis Santa wrote: Reason for CRM: LEFT VOICEMAIL FOR PT TO CALL BACK [phone] TO [facility]

## 2022-06-04 ENCOUNTER — Encounter: Payer: Commercial Managed Care - HMO | Admitting: Family Medicine

## 2022-06-23 DIAGNOSIS — R490 Dysphonia: Secondary | ICD-10-CM | POA: Diagnosis not present

## 2022-06-23 DIAGNOSIS — K219 Gastro-esophageal reflux disease without esophagitis: Secondary | ICD-10-CM | POA: Diagnosis not present

## 2022-07-03 ENCOUNTER — Other Ambulatory Visit: Payer: Self-pay | Admitting: Family Medicine

## 2022-07-16 ENCOUNTER — Telehealth: Payer: Self-pay

## 2022-07-16 NOTE — Telephone Encounter (Signed)
Called patient to schedule Medicare Annual Wellness Visit (AWV). Left message for patient to call back and schedule Medicare Annual Wellness Visit (AWV).  Last date of AWV: NO HX OF AWV, eligible 02/02/22  Please schedule an appointment at any time with [facility].  Norton Blizzard, [place] (AAMA)  [facility] [phone]

## 2022-07-27 ENCOUNTER — Telehealth: Payer: Self-pay | Admitting: Family Medicine

## 2022-07-27 NOTE — Telephone Encounter (Signed)
Contacted [facility] to schedule their annual wellness visit. Appointment made for 07/28/2022.  [facility] Direct Dial [phone]

## 2022-07-28 ENCOUNTER — Ambulatory Visit: Payer: 59

## 2022-09-03 DIAGNOSIS — R739 Hyperglycemia, unspecified: Secondary | ICD-10-CM | POA: Diagnosis not present

## 2022-09-03 DIAGNOSIS — Z0000 Encounter for general adult medical examination without abnormal findings: Secondary | ICD-10-CM | POA: Diagnosis not present

## 2022-09-03 DIAGNOSIS — M109 Gout, unspecified: Secondary | ICD-10-CM | POA: Diagnosis not present

## 2022-09-03 DIAGNOSIS — Z1329 Encounter for screening for other suspected endocrine disorder: Secondary | ICD-10-CM | POA: Diagnosis not present

## 2022-09-03 DIAGNOSIS — E785 Hyperlipidemia, unspecified: Secondary | ICD-10-CM | POA: Diagnosis not present

## 2022-09-03 DIAGNOSIS — Z13220 Encounter for screening for lipoid disorders: Secondary | ICD-10-CM | POA: Diagnosis not present

## 2022-09-14 DIAGNOSIS — Z20822 Contact with and (suspected) exposure to covid-19: Secondary | ICD-10-CM | POA: Diagnosis not present

## 2022-09-14 DIAGNOSIS — R197 Diarrhea, unspecified: Secondary | ICD-10-CM | POA: Diagnosis not present

## 2022-10-29 DIAGNOSIS — R109 Unspecified abdominal pain: Secondary | ICD-10-CM | POA: Diagnosis not present

## 2022-10-29 DIAGNOSIS — R197 Diarrhea, unspecified: Secondary | ICD-10-CM | POA: Diagnosis not present

## 2022-10-29 DIAGNOSIS — K5900 Constipation, unspecified: Secondary | ICD-10-CM | POA: Diagnosis not present

## 2022-11-05 DIAGNOSIS — K5900 Constipation, unspecified: Secondary | ICD-10-CM | POA: Diagnosis not present

## 2022-11-05 DIAGNOSIS — R590 Localized enlarged lymph nodes: Secondary | ICD-10-CM | POA: Diagnosis not present

## 2023-06-27 ENCOUNTER — Other Ambulatory Visit: Payer: Self-pay | Admitting: Family Medicine

## 2023-06-29 ENCOUNTER — Other Ambulatory Visit: Payer: Self-pay | Admitting: Family Medicine

## 2023-07-12 ENCOUNTER — Other Ambulatory Visit: Payer: Self-pay | Admitting: Family Medicine

## 2023-09-10 NOTE — Progress Notes (Signed)
 Patient ID:  Thomas Salinas is a 48 y.o. (DOB [birth year]) male.   Assessment   1. Ingrowing nail, left great toe   2. Acquired hallux valgus of right foot   3. Acquired hallux valgus of left foot   4. Acrocyanosis      Plan  I did review the nature of the patient's condition with the patient in detail. The primary encounter diagnosis was Ingrowing nail, left great toe. Diagnoses of Acquired hallux valgus of right foot, Acquired hallux valgus of left foot, and Acrocyanosis were also pertinent to this visit.   I did discuss the consequences if the patient's conditions begin to worsen.  I did discuss the pathology, anatomy, and biomechanics of their conditions with them in detail.  The patient's treatment plan was formulated on the basis of his current conditions, overall medical condition, activity and social issues.  Treatment options were discussed.  Patient is status post partial nail avulsion with matrixectomy to the left great toe.  He is doing well overall with no signs of infection.  He has no further complaints at this time.  He does have continued erythema to the forefoot area.  He had a previous ultrasound with no pathology seen.  I have discussed with patient that this is likely secondary to vasospastic condition.  He has no wound formations or further issues with this area.  He will continue to monitor.  Will continue to try to keep the area warm.  Additionally, there are significant bilateral forefoot deformities with hallux valgus. I discussed the use of appropriate padding and offloading modalities including toe spacers and Ortho digital devices to protect their toes from mechanical friction from shoes.  We also discussed the importance of appropriate shoes with deep, wide toe boxes to accommodate their foot structure.    Risks, benefits, and alternatives of the medications and treatment plan prescribed today were discussed, and patient expressed understanding and agreement  with the plan.   No orders of the defined types were placed in this encounter.  No follow-ups on file.   Patient's Medications       * Accurate as of September 10, 2023  1:11 PM. Reflects encounter med changes as of last refresh          Continued Medications      Instructions  allopurinol  100 mg tablet Commonly known as: ZYLOPRIM   100 mg, Oral, Daily,     azelastine  0.1 % nasal spray Commonly known as: ASTELIN   2 sprays   nitroGLYCERIN 0.2 mg/hour patch Commonly known as: NITRODUR  PLACE 1 PATCH ONTO THE SKIN DAILY AS DIRECTED   omeprazole 40 mg capsule Commonly known as: PRILOSEC  40 mg, 2 times a day         Allergies No Known Allergies  Subjective    HPI:  Thomas Salinas is a 48 y.o. male who is following up to the office today and presents for evaluation of ingrown toenail to left great toe.  He underwent a partial nail avulsion.  He is doing well overall.  Additionally, he has been presenting with discoloration noted to the left first and second digits.  He was kindly referred to our office by his primary care provider, Redge Budge NP, who had prescribed patient Keflex.  Objective   Physical Exam:   Constitutional: The patient is alert and oriented x 3, well-nourished and well developed and in no apparent distress.  Vascular: The dorsalis pedis and posterior tibial pulses are  palpable bilaterally.  There is good perfusion to the digital level with a capillary refill time of less than 3 seconds bilaterally.  There is normal proximal to distal cooling of both lower extremities.  No appreciable edema is noted to both lower extremities.  Neurological: Epicritic sensation is grossly intact to both lower extremities.  The deep tendon reflexes are present and symmetrical bilaterally.  Dermatological: Inspection and palpation of the skin and subcutaneous tissues reveals normal temperature, texture and turgor of both lower extremities.  The nails of both feet do  not reveal any signs of drainage.  There are no open wounds noted to both lower extremities.  Erythema noted to distal aspect of left first and second digits which improves mildly with elevation.  Status post left medial border permanent matricectomy.  Musculoskeletal: There is good ankle joint dorsiflexion with the knee extended and the knee flexed bilaterally.  No pain or crepitus are noted with range of motion of the subtalar and the ankle joints bilaterally.  There is good stability noted of the ankle, subtalar, mid-tarsal and metatarsophalangeal joints of both lower extremities.  No signs of effusions or masses are noted bilaterally.  There is normal muscle strength, tone, stability and range of motion of all muscle groups.  A normal base and angle of gait are noted bilaterally.  Track bound hallux valgus deformities of bilateral lower extremities.  Review of Systems General ROS: negative Opthalmic ROS: negative ENT ROS: negative Cardiovascular ROS: negative Respiratory ROS: negative Gastrointestinal ROS: negative Genito-Urinary ROS: negative Musculoskeletal ROS: negative Dermatological ROS: negative Neurological ROS: negative Pyschological ROS: negative Endocrine ROS: negative Hematological & Lymphatic ROS: negative Allergy & Immunology ROS: negative  There were no vitals taken for this visit.   Allergies No Known Allergies  Patient Active Problem List   Diagnosis Date Noted  . Dyslipidemia 06/02/2021  . Hyperglycemia 06/02/2021    Last Assessment & Plan:   Formatting of this note might be different from the original.  A1c stable at 5.8 today.  Continue lifestyle modifications and we can recheck again in 6 months.   . Down syndrome (*)   . Gout    Past Surgical History:  Procedure Laterality Date  . Atrioventricular canal repair    . Cholecystectomy  08/2012  . Tonsillectomy    . Tympanoplasty w/ mastoidectomy Left   . Ventral hernia repair     Past Medical History:   Diagnosis Date  . Down syndrome (*)   . Gout    Social History   Socioeconomic History  . Marital status: Single  Tobacco Use  . Smoking status: Never  . Smokeless tobacco: Never  Vaping Use  . Vaping status: Never Used  Substance and Sexual Activity  . Alcohol use: No  . Drug use: No  . Sexual activity: Never    Family History  Problem Relation Age of Onset  . Colon polyps Mother   . Colon polyps Father   . Rectal cancer Neg Hx   . Stomach cancer Neg Hx     There is no immunization history on file for this patient. Health Maintenance  Topic Date Due  . Colorectal Cancer Screening  Never done  . CBC  07/05/2013  . COVID-19 Vaccine (4 - 2024-25 season) 01/24/2023  . Free T4  09/03/2023  . TSH Level  09/03/2023  . Creatinine Level  09/03/2023  . Medicare Annual Wellness  09/03/2023  . DTaP/Tdap/Td Vaccines (2 - Td or Tdap) 12/15/2023  . Zoster Vaccine (1  of 2) 05/21/2028  . Meningococcal B Vaccine  Aged Out  . Pneumococcal Vaccine: Pediatrics and At Risk Patients  Aged Out  . Hepatitis A Vaccine  Aged Out  . Meningococcal Conjugate Vaccine  Aged Out  . HPV Vaccine  Aged Out   No care team member to display   I dictated a portion of the note using voice recognition software, along with smart phrases and written information directly from the patient that have been entered by myself or by my assistant or verbal information during the face-to-face time entered by my assistant and minor syntax, contextual, and spelling errors may be related to the use of this software and were not intentional. I have reviewed the information contained in this note and personally verified its accuracy.  I obtained the history of present illness and personally performed the physical exam. A written report was generated and sent electronically or by fax to the consulting physician. *Some images could not be shown.

## 2023-09-28 DIAGNOSIS — H9190 Unspecified hearing loss, unspecified ear: Secondary | ICD-10-CM | POA: Diagnosis not present

## 2023-09-28 DIAGNOSIS — E782 Mixed hyperlipidemia: Secondary | ICD-10-CM | POA: Diagnosis not present

## 2023-09-28 DIAGNOSIS — Z23 Encounter for immunization: Secondary | ICD-10-CM | POA: Diagnosis not present

## 2023-09-28 DIAGNOSIS — R7309 Other abnormal glucose: Secondary | ICD-10-CM | POA: Diagnosis not present

## 2023-09-28 DIAGNOSIS — Z0000 Encounter for general adult medical examination without abnormal findings: Secondary | ICD-10-CM | POA: Diagnosis not present

## 2023-09-28 DIAGNOSIS — H6120 Impacted cerumen, unspecified ear: Secondary | ICD-10-CM | POA: Diagnosis not present

## 2023-09-28 DIAGNOSIS — Z5181 Encounter for therapeutic drug level monitoring: Secondary | ICD-10-CM | POA: Diagnosis not present

## 2023-09-28 DIAGNOSIS — M109 Gout, unspecified: Secondary | ICD-10-CM | POA: Diagnosis not present

## 2023-09-28 DIAGNOSIS — Z1211 Encounter for screening for malignant neoplasm of colon: Secondary | ICD-10-CM | POA: Diagnosis not present

## 2023-11-07 DIAGNOSIS — Z1212 Encounter for screening for malignant neoplasm of rectum: Secondary | ICD-10-CM | POA: Diagnosis not present

## 2023-11-07 DIAGNOSIS — Z1211 Encounter for screening for malignant neoplasm of colon: Secondary | ICD-10-CM | POA: Diagnosis not present

## 2024-01-14 DIAGNOSIS — D751 Secondary polycythemia: Secondary | ICD-10-CM | POA: Diagnosis not present

## 2024-01-14 DIAGNOSIS — E119 Type 2 diabetes mellitus without complications: Secondary | ICD-10-CM | POA: Diagnosis not present

## 2024-01-14 DIAGNOSIS — R229 Localized swelling, mass and lump, unspecified: Secondary | ICD-10-CM | POA: Diagnosis not present

## 2024-01-17 ENCOUNTER — Encounter: Payer: Self-pay | Admitting: Family Medicine

## 2024-01-17 ENCOUNTER — Other Ambulatory Visit: Payer: Self-pay | Admitting: Family Medicine

## 2024-01-17 DIAGNOSIS — R229 Localized swelling, mass and lump, unspecified: Secondary | ICD-10-CM

## 2024-01-18 ENCOUNTER — Ambulatory Visit
Admission: RE | Admit: 2024-01-18 | Discharge: 2024-01-18 | Disposition: A | Discharge status: Home / Self Care | Source: Ambulatory Visit | Attending: Family Medicine | Admitting: Family Medicine

## 2024-01-18 DIAGNOSIS — R229 Localized swelling, mass and lump, unspecified: Secondary | ICD-10-CM

## 2024-01-18 DIAGNOSIS — K439 Ventral hernia without obstruction or gangrene: Secondary | ICD-10-CM | POA: Diagnosis not present

## 2024-02-21 ENCOUNTER — Ambulatory Visit: Payer: Self-pay | Admitting: Surgery

## 2024-02-21 ENCOUNTER — Encounter: Payer: Self-pay | Admitting: Surgery

## 2024-02-21 DIAGNOSIS — K4090 Unilateral inguinal hernia, without obstruction or gangrene, not specified as recurrent: Secondary | ICD-10-CM | POA: Diagnosis not present

## 2024-02-21 DIAGNOSIS — K432 Incisional hernia without obstruction or gangrene: Secondary | ICD-10-CM | POA: Diagnosis not present

## 2024-04-03 NOTE — Patient Instructions (Signed)
 SURGICAL WAITING ROOM VISITATION Patients having surgery or a procedure may have no more than 2 support people in the waiting area - these visitors may rotate in the visitor waiting room.   If the patient needs to stay at the hospital during part of their recovery, the visitor guidelines for inpatient rooms apply.  PRE-OP VISITATION  Pre-op nurse will coordinate an appropriate time for 1 support person to accompany the patient in pre-op.  This support person may not rotate.  This visitor will be contacted when the time is appropriate for the visitor to come back in the pre-op area.  Please refer to the [facility] website for the visitor guidelines for Inpatients (after your surgery is over and you are in a regular room).  You are not required to quarantine at this time prior to your surgery. However, you must do this: Hand Hygiene often Do NOT share personal items Notify your provider if you are in close contact with someone who has COVID or you develop fever 100.4 or greater, new onset of sneezing, cough, sore throat, shortness of breath or body aches.  If you test positive for Covid or have been in contact with anyone that has tested positive in the last 10 days please notify you surgeon.    Your procedure is scheduled on:  Wednesday  April 12, 2024  Report to [facility] Main Entrance: [address] entrance where the [employer] is available.   Report to admitting at: 10:45    AM  Call this number if you have any questions or problems the morning of surgery [phone]  FOLLOW ANY ADDITIONAL PRE OP INSTRUCTIONS YOU RECEIVED FROM YOUR SURGEON'S OFFICE!!!  DRINK two (2) bottles of Pre-Surgery Clear Ensure starting at 6:00 pm the evening prior to your surgery to help prevent dehydration. Increase drinking clear fluids (see list below)           Do not eat food after Midnight the night prior to your surgery/procedure.  After Midnight you may have the following liquids until   10:00 AM  DAY OF SURGERY  Clear Liquid Diet Water Black Coffee (sugar ok, NO MILK/CREAM OR CREAMERS)  Tea (sugar ok, NO MILK/CREAM OR CREAMERS) regular and decaf                             Plain Jell-O  with no fruit (NO RED)                                           Fruit ices (not with fruit pulp, NO RED)                                     Popsicles (NO RED)                                                                  Juice: NO CITRUS JUICES: only apple, WHITE grape, WHITE cranberry Sports drinks like Gatorade or Powerade (NO RED)  The day of surgery:  Drink ONE (1) Pre-Surgery Clear Ensure at 10:00 AM the morning of surgery. Drink in one sitting. Do not sip.  This drink was given to you during your hospital pre-op appointment visit. Nothing else to drink after completing the Pre-Surgery Clear Ensure : No candy, chewing gum or throat lozenges.     Oral Hygiene is also important to reduce your risk of infection.        Remember - BRUSH YOUR TEETH THE MORNING OF SURGERY WITH YOUR REGULAR TOOTHPASTE  Do NOT smoke after Midnight the night before surgery.  STOP TAKING all Vitamins, Herbs and supplements 1 week before your surgery.   Take ONLY these medicines the morning of surgery with A SIP OF WATER: omeprazole, allopurinol .    If You have been diagnosed with Sleep Apnea - Bring CPAP mask and tubing day of surgery. We will provide you with a CPAP machine on the day of your surgery.                   You may not have any metal on your body including  jewelry, and body piercing  Do not wear  lotions, powders, cologne, or deodorant  Men may shave face and neck.  Contacts, Hearing Aids, dentures or bridgework may not be worn into surgery. DENTURES WILL BE REMOVED PRIOR TO SURGERY PLEASE DO NOT APPLY Poly grip OR ADHESIVES!!!  Patients discharged on the day of surgery will not be allowed to drive home.  Someone NEEDS to stay with you for the first 24 hours  after anesthesia.  Do not bring your home medications to the hospital. The Pharmacy will dispense medications listed on your medication list to you during your admission in the Hospital.  Please read over the following fact sheets you were given: IF YOU HAVE QUESTIONS ABOUT YOUR PRE-OP INSTRUCTIONS, PLEASE CALL [phone]   [facility] - Preparing for Surgery        Before surgery, you can play an important role.  Because skin is not sterile, your skin needs to be as free of germs as possible.  You can reduce the number of germs on your skin by washing with CHG (chlorahexidine gluconate) soap before surgery.  CHG is an antiseptic cleaner which kills germs and bonds with the skin to continue killing germs even after washing. Please DO NOT use if you have an allergy to CHG or antibacterial soaps.  If your skin becomes reddened/irritated stop using the CHG and inform your nurse when you arrive at Short Stay. Do not shave (including legs and underarms) for at least 48 hours prior to the first CHG shower.  You may shave your face/neck.  Please follow these instructions carefully:  1.  Shower with CHG Soap the night before surgery ONLY (DO NOT USE THE CHG SOAP THE MORNING OF SURGERY).  2.  If you choose to wash your hair, wash your hair first as usual with your normal  shampoo.  3.  After you shampoo, rinse your hair and body thoroughly to remove the shampoo.                             4.  Use CHG as you would any other liquid soap.  You can apply chg directly to the skin and wash.  Gently with a scrungie or clean washcloth.  5.  Apply the CHG Soap to your body ONLY FROM THE NECK DOWN.  Do not use on face/ open                           Wound or open sores. Avoid contact with eyes, ears mouth and genitals (private parts).                       Wash face,  Genitals (private parts) with your normal soap.             6.  Wash thoroughly, paying special attention to the area where your  surgery  will  be performed.  7.  Thoroughly rinse your body with warm water from the neck down.  8.  DO NOT shower/wash with your normal soap after using and rinsing off the CHG Soap.                9.  Pat yourself dry with a clean towel.            10.  Wear clean pajamas.            11.  Place clean sheets on your bed the night of your first shower and do not  sleep with pets.  Day of Surgery : Do not apply any CHG, lotions/deodorants the morning of surgery.  Please wear clean clothes to the hospital/surgery center.   FAILURE TO FOLLOW THESE INSTRUCTIONS MAY RESULT IN THE CANCELLATION OF YOUR SURGERY  PATIENT SIGNATURE_________________________________  NURSE SIGNATURE__________________________________  ________________________________________________________________________

## 2024-04-03 NOTE — Progress Notes (Signed)
 COVID Vaccine received:  []  No [x]  Yes Date of any COVID positive Test in last 90 days:  PCP - Worth Kitty, MD [employer], MD  Cardiologist -   Chest x-ray -  EKG -  2023  repeat Stress Test -  ECHO -  Cardiac Cath -  CT Coronary Calcium score:   Bowel Prep - []  No  []   Yes ___Ensure x 3___  Pacemaker / ICD device [x]  No []  Yes   Spinal Cord Stimulator:[x]  No []  Yes       History of Sleep Apnea? []  No []  Yes   CPAP used?- []  No []  Yes    Patient has: []  NO Hx DM   [x]  Pre-DM   []  DM1  []   DM2 Does the patient monitor blood sugar?   []  N/A   [x]  No []  Yes  Last A1c was 5.9   on   01-16-2024    Blood Thinner / Instructions:  none Aspirin Instructions:  none  Comments:   Activity level: Able to walk up 2 flights of stairs without becoming significantly short of breath or having chest pain?  []  No   []    Yes  Patient can perform ADLs without assistance. []  No   []   Yes  Anesthesia review: Down's Syndrome, GERD, gout, Fatty liver, Pre-DM no meds, Hx Mitral valve repair?  AV canal repair??  Patient denies any S&S of respiratory illness or Covid - no shortness of breath, fever, cough or chest pain at PAT appointment.  Patient verbalized understanding and agreement to the Pre-Surgical Instructions that were given to them at this PAT appointment. Patient was also educated of the need to review these PAT instructions again prior to his surgery.I reviewed the appropriate phone numbers to call if they have any and questions or concerns.

## 2024-04-04 ENCOUNTER — Encounter (HOSPITAL_COMMUNITY)
Admission: RE | Admit: 2024-04-04 | Discharge: 2024-04-04 | Disposition: A | Source: Ambulatory Visit | Attending: Surgery

## 2024-04-04 ENCOUNTER — Other Ambulatory Visit: Payer: Self-pay

## 2024-04-04 ENCOUNTER — Encounter (HOSPITAL_COMMUNITY): Payer: Self-pay

## 2024-04-04 VITALS — BP 110/60 | HR 64 | Temp 98.2°F | Resp 16 | Ht 60.0 in | Wt 162.0 lb

## 2024-04-04 DIAGNOSIS — R7303 Prediabetes: Secondary | ICD-10-CM | POA: Insufficient documentation

## 2024-04-04 DIAGNOSIS — Z01818 Encounter for other preprocedural examination: Secondary | ICD-10-CM | POA: Insufficient documentation

## 2024-04-04 DIAGNOSIS — K760 Fatty (change of) liver, not elsewhere classified: Secondary | ICD-10-CM | POA: Diagnosis not present

## 2024-04-04 HISTORY — DX: Prediabetes: R73.03

## 2024-04-04 HISTORY — DX: Gastro-esophageal reflux disease without esophagitis: K21.9

## 2024-04-04 HISTORY — DX: Unspecified osteoarthritis, unspecified site: M19.90

## 2024-04-04 HISTORY — DX: Other specified postprocedural states: Z98.890

## 2024-04-04 LAB — CBC
HCT: 52.4 % — ABNORMAL HIGH (ref 39.0–52.0)
Hemoglobin: 17.8 g/dL — ABNORMAL HIGH (ref 13.0–17.0)
MCH: 32.8 pg (ref 26.0–34.0)
MCHC: 34 g/dL (ref 30.0–36.0)
MCV: 96.7 fL (ref 80.0–100.0)
Platelets: 182 K/uL (ref 150–400)
RBC: 5.42 MIL/uL (ref 4.22–5.81)
RDW: 13.9 % (ref 11.5–15.5)
WBC: 5.9 K/uL (ref 4.0–10.5)
nRBC: 0 % (ref 0.0–0.2)

## 2024-04-04 LAB — COMPREHENSIVE METABOLIC PANEL WITH GFR
ALT: 34 U/L (ref 0–44)
AST: 35 U/L (ref 15–41)
Albumin: 3.9 g/dL (ref 3.5–5.0)
Alkaline Phosphatase: 58 U/L (ref 38–126)
Anion gap: 9 (ref 5–15)
BUN: 13 mg/dL (ref 6–20)
CO2: 28 mmol/L (ref 22–32)
Calcium: 8.9 mg/dL (ref 8.9–10.3)
Chloride: 101 mmol/L (ref 98–111)
Creatinine, Ser: 1.19 mg/dL (ref 0.61–1.24)
GFR, Estimated: >60 mL/min (ref >60)
Glucose, Bld: 104 mg/dL — ABNORMAL HIGH (ref 70–99)
Potassium: 4.4 mmol/L (ref 3.5–5.1)
Sodium: 138 mmol/L (ref 135–145)
Total Bilirubin: 0.7 mg/dL (ref 0.0–1.2)
Total Protein: 7.1 g/dL (ref 6.5–8.1)

## 2024-04-12 ENCOUNTER — Encounter (HOSPITAL_COMMUNITY): Payer: Self-pay | Admitting: Surgery

## 2024-04-12 ENCOUNTER — Ambulatory Visit (HOSPITAL_COMMUNITY): Admitting: Certified Registered"

## 2024-04-12 ENCOUNTER — Encounter (HOSPITAL_COMMUNITY)
Admission: RE | Disposition: A | Discharge status: Home / Self Care | Payer: Self-pay | Source: Home / Self Care | Attending: Surgery

## 2024-04-12 ENCOUNTER — Observation Stay (HOSPITAL_COMMUNITY)
Admission: RE | Admit: 2024-04-12 | Discharge: 2024-04-13 | Disposition: A | Discharge status: Home / Self Care | Attending: Surgery | Admitting: Surgery

## 2024-04-12 DIAGNOSIS — K4020 Bilateral inguinal hernia, without obstruction or gangrene, not specified as recurrent: Secondary | ICD-10-CM | POA: Insufficient documentation

## 2024-04-12 DIAGNOSIS — K430 Incisional hernia with obstruction, without gangrene: Principal | ICD-10-CM | POA: Insufficient documentation

## 2024-04-12 DIAGNOSIS — K432 Incisional hernia without obstruction or gangrene: Secondary | ICD-10-CM | POA: Diagnosis present

## 2024-04-12 DIAGNOSIS — K439 Ventral hernia without obstruction or gangrene: Secondary | ICD-10-CM | POA: Diagnosis not present

## 2024-04-12 HISTORY — PX: XI ROBOTIC ASSISTED VENTRAL HERNIA: SHX6789

## 2024-04-12 HISTORY — PX: XI ROBOTIC ASSISTED INGUINAL HERNIA REPAIR WITH MESH: SHX6706

## 2024-04-12 SURGERY — REPAIR, HERNIA, VENTRAL, ROBOT-ASSISTED
Anesthesia: General | Site: Abdomen | Laterality: Right

## 2024-04-12 MED ORDER — VASOPRESSIN 20 UNIT/ML IV SOLN
INTRAVENOUS | Status: DC | PRN
  Administered 2024-04-12 (×6): 1 [IU] via INTRAVENOUS

## 2024-04-12 MED ORDER — GABAPENTIN 300 MG PO CAPS
300.0000 mg | ORAL_CAPSULE | ORAL | Status: AC
  Administered 2024-04-12: 300 mg via ORAL
  Filled 2024-04-12: qty 1

## 2024-04-12 MED ORDER — CHLORHEXIDINE GLUCONATE CLOTH 2 % EX PADS: 6.0000 | MEDICATED_PAD | Freq: Once | CUTANEOUS | Status: DC

## 2024-04-12 MED ORDER — KETOROLAC TROMETHAMINE 30 MG/ML IJ SOLN
30.0000 mg | Freq: Once | INTRAMUSCULAR | Status: AC | PRN
  Administered 2024-04-12: 30 mg via INTRAVENOUS

## 2024-04-12 MED ORDER — SODIUM CHLORIDE 0.9 % IV SOLN: 250.0000 mL | INTRAVENOUS | Status: DC | PRN

## 2024-04-12 MED ORDER — HYDROMORPHONE HCL 1 MG/ML IJ SOLN
0.5000 mg | INTRAMUSCULAR | Status: DC | PRN
  Administered 2024-04-13: 1 mg via INTRAVENOUS
  Filled 2024-04-12: qty 1

## 2024-04-12 MED ORDER — OXYCODONE HCL 5 MG PO TABS
5.0000 mg | ORAL_TABLET | Freq: Once | ORAL | Status: AC | PRN
  Administered 2024-04-12: 5 mg via ORAL

## 2024-04-12 MED ORDER — SODIUM CHLORIDE 0.9% FLUSH: 3.0000 mL | Freq: Two times a day (BID) | INTRAVENOUS | Status: DC

## 2024-04-12 MED ORDER — ACETAMINOPHEN 10 MG/ML IV SOLN
1000.0000 mg | Freq: Once | INTRAVENOUS | Status: DC | PRN
  Administered 2024-04-12: 1000 mg via INTRAVENOUS

## 2024-04-12 MED ORDER — SODIUM CHLORIDE 0.9% FLUSH: 3.0000 mL | INTRAVENOUS | Status: DC | PRN

## 2024-04-12 MED ORDER — ACETAMINOPHEN 500 MG PO TABS
1000.0000 mg | ORAL_TABLET | ORAL | Status: AC
  Administered 2024-04-12: 1000 mg via ORAL
  Filled 2024-04-12: qty 2

## 2024-04-12 MED ORDER — PHENOL 1.4 % MT LIQD: 2.0000 | OROMUCOSAL | Status: DC | PRN

## 2024-04-12 MED ORDER — SODIUM CHLORIDE 0.9% FLUSH
3.0000 mL | Freq: Two times a day (BID) | INTRAVENOUS | Status: DC
  Administered 2024-04-13: 3 mL via INTRAVENOUS

## 2024-04-12 MED ORDER — ACETAMINOPHEN 10 MG/ML IV SOLN
INTRAVENOUS | Status: AC
  Filled 2024-04-12: qty 100

## 2024-04-12 MED ORDER — OXYCODONE HCL 5 MG/5ML PO SOLN: 5.0000 mg | Freq: Once | ORAL | Status: AC | PRN

## 2024-04-12 MED ORDER — ONDANSETRON HCL 4 MG/2ML IJ SOLN: 4.0000 mg | Freq: Four times a day (QID) | INTRAMUSCULAR | Status: DC | PRN

## 2024-04-12 MED ORDER — ACETAMINOPHEN 500 MG PO TABS
1000.0000 mg | ORAL_TABLET | Freq: Four times a day (QID) | ORAL | Status: DC
  Administered 2024-04-12 – 2024-04-13 (×2): 1000 mg via ORAL
  Filled 2024-04-12 (×2): qty 2

## 2024-04-12 MED ORDER — BISACODYL 10 MG RE SUPP: 10.0000 mg | Freq: Every day | RECTAL | Status: DC | PRN

## 2024-04-12 MED ORDER — FENTANYL CITRATE (PF) 100 MCG/2ML IJ SOLN
INTRAMUSCULAR | Status: AC
  Filled 2024-04-12: qty 2

## 2024-04-12 MED ORDER — METOPROLOL TARTRATE 5 MG/5ML IV SOLN: 5.0000 mg | Freq: Four times a day (QID) | INTRAVENOUS | Status: DC | PRN

## 2024-04-12 MED ORDER — FENTANYL CITRATE (PF) 50 MCG/ML IJ SOSY: 25.0000 ug | PREFILLED_SYRINGE | INTRAMUSCULAR | Status: DC | PRN

## 2024-04-12 MED ORDER — KETOROLAC TROMETHAMINE 30 MG/ML IJ SOLN
INTRAMUSCULAR | Status: AC
  Filled 2024-04-12: qty 1

## 2024-04-12 MED ORDER — ORAL CARE MOUTH RINSE: 15.0000 mL | Freq: Once | OROMUCOSAL | Status: AC

## 2024-04-12 MED ORDER — GABAPENTIN 100 MG PO CAPS
300.0000 mg | ORAL_CAPSULE | Freq: Two times a day (BID) | ORAL | Status: DC
  Administered 2024-04-12 – 2024-04-13 (×2): 300 mg via ORAL
  Filled 2024-04-12 (×2): qty 3

## 2024-04-12 MED ORDER — FENTANYL CITRATE (PF) 250 MCG/5ML IJ SOLN
INTRAMUSCULAR | Status: DC | PRN
  Administered 2024-04-12 (×2): 50 ug via INTRAVENOUS

## 2024-04-12 MED ORDER — TRAMADOL HCL 50 MG PO TABS: 50.0000 mg | ORAL_TABLET | Freq: Four times a day (QID) | ORAL | Status: DC | PRN

## 2024-04-12 MED ORDER — DIPHENHYDRAMINE HCL 12.5 MG/5ML PO ELIX
12.5000 mg | ORAL_SOLUTION | Freq: Four times a day (QID) | ORAL | Status: DC | PRN
Start: 2024-04-12 — End: 2024-04-13

## 2024-04-12 MED ORDER — MIDAZOLAM HCL (PF) 2 MG/2ML IJ SOLN
INTRAMUSCULAR | Status: DC | PRN
  Administered 2024-04-12: 2 mg via INTRAVENOUS

## 2024-04-12 MED ORDER — SIMETHICONE 80 MG PO CHEW: 40.0000 mg | CHEWABLE_TABLET | Freq: Four times a day (QID) | ORAL | Status: DC | PRN

## 2024-04-12 MED ORDER — ALLOPURINOL 100 MG PO TABS
100.0000 mg | ORAL_TABLET | Freq: Every day | ORAL | Status: DC
  Administered 2024-04-13: 100 mg via ORAL
  Filled 2024-04-12: qty 1

## 2024-04-12 MED ORDER — CYCLOBENZAPRINE HCL 5 MG PO TABS: 5.0000 mg | ORAL_TABLET | Freq: Three times a day (TID) | ORAL | Status: DC | PRN

## 2024-04-12 MED ORDER — ENOXAPARIN SODIUM 40 MG/0.4ML IJ SOSY
40.0000 mg | PREFILLED_SYRINGE | INTRAMUSCULAR | Status: DC
  Administered 2024-04-13: 40 mg via SUBCUTANEOUS
  Filled 2024-04-12: qty 0.4

## 2024-04-12 MED ORDER — OXYCODONE HCL 5 MG PO TABS: 5.0000 mg | ORAL_TABLET | ORAL | Status: DC | PRN

## 2024-04-12 MED ORDER — PHENYLEPHRINE HCL-NACL 20-0.9 MG/250ML-% IV SOLN
INTRAVENOUS | Status: DC | PRN
Start: 2024-04-12 — End: 2024-04-12
  Administered 2024-04-12: 50 ug/min via INTRAVENOUS

## 2024-04-12 MED ORDER — CEFAZOLIN SODIUM-DEXTROSE 2-4 GM/100ML-% IV SOLN
2.0000 g | INTRAVENOUS | Status: AC
  Administered 2024-04-12: 2 g via INTRAVENOUS
  Filled 2024-04-12: qty 100

## 2024-04-12 MED ORDER — ALUM & MAG HYDROXIDE-SIMETH 200-200-20 MG/5ML PO SUSP: 30.0000 mL | Freq: Four times a day (QID) | ORAL | Status: DC | PRN

## 2024-04-12 MED ORDER — CEFAZOLIN SODIUM-DEXTROSE 2-4 GM/100ML-% IV SOLN
2.0000 g | Freq: Three times a day (TID) | INTRAVENOUS | Status: AC
  Administered 2024-04-12: 2 g via INTRAVENOUS
  Filled 2024-04-12: qty 100

## 2024-04-12 MED ORDER — ROCURONIUM BROMIDE 10 MG/ML (PF) SYRINGE
PREFILLED_SYRINGE | INTRAVENOUS | Status: AC
  Filled 2024-04-12: qty 10

## 2024-04-12 MED ORDER — ALBUMIN HUMAN 5 % IV SOLN: INTRAVENOUS | Status: DC | PRN

## 2024-04-12 MED ORDER — LACTATED RINGERS IV BOLUS: 1000.0000 mL | Freq: Three times a day (TID) | INTRAVENOUS | Status: DC | PRN

## 2024-04-12 MED ORDER — PHENYLEPHRINE HCL (PRESSORS) 10 MG/ML IV SOLN
INTRAVENOUS | Status: DC | PRN
  Administered 2024-04-12 (×2): 200 ug via INTRAVENOUS

## 2024-04-12 MED ORDER — EPHEDRINE SULFATE (PRESSORS) 25 MG/5ML IV SOSY
PREFILLED_SYRINGE | INTRAVENOUS | Status: DC | PRN
  Administered 2024-04-12: 15 mg via INTRAVENOUS

## 2024-04-12 MED ORDER — ONDANSETRON HCL 4 MG/2ML IJ SOLN
INTRAMUSCULAR | Status: AC
  Filled 2024-04-12: qty 2

## 2024-04-12 MED ORDER — SIMETHICONE 40 MG/0.6ML PO SUSP: 80.0000 mg | Freq: Four times a day (QID) | ORAL | Status: DC | PRN

## 2024-04-12 MED ORDER — PROPOFOL 10 MG/ML IV BOLUS
INTRAVENOUS | Status: AC
  Filled 2024-04-12: qty 20

## 2024-04-12 MED ORDER — LACTATED RINGERS IV SOLN: Freq: Three times a day (TID) | INTRAVENOUS | Status: DC | PRN

## 2024-04-12 MED ORDER — PROCHLORPERAZINE MALEATE 10 MG PO TABS: 10.0000 mg | ORAL_TABLET | Freq: Four times a day (QID) | ORAL | Status: DC | PRN

## 2024-04-12 MED ORDER — PROCHLORPERAZINE EDISYLATE 10 MG/2ML IJ SOLN: 5.0000 mg | Freq: Four times a day (QID) | INTRAMUSCULAR | Status: DC | PRN

## 2024-04-12 MED ORDER — OXYCODONE HCL 5 MG PO TABS
ORAL_TABLET | ORAL | Status: AC
  Filled 2024-04-12: qty 1

## 2024-04-12 MED ORDER — ROCURONIUM 10MG/ML (10ML) SYRINGE FOR MEDFUSION PUMP - OPTIME
INTRAVENOUS | Status: DC | PRN
  Administered 2024-04-12: 50 mg via INTRAVENOUS
  Administered 2024-04-12: 30 mg via INTRAVENOUS
  Administered 2024-04-12: 50 mg via INTRAVENOUS

## 2024-04-12 MED ORDER — KCL IN DEXTROSE-NACL 20-5-0.45 MEQ/L-%-% IV SOLN
INTRAVENOUS | Status: DC
  Filled 2024-04-12: qty 1000

## 2024-04-12 MED ORDER — DROPERIDOL 2.5 MG/ML IJ SOLN: 0.6250 mg | Freq: Once | INTRAMUSCULAR | Status: DC | PRN

## 2024-04-12 MED ORDER — CALCIUM CHLORIDE 10 % IV SOLN
INTRAVENOUS | Status: DC | PRN
  Administered 2024-04-12 (×3): 100 mg via INTRAVENOUS

## 2024-04-12 MED ORDER — NAPHAZOLINE-GLYCERIN 0.012-0.25 % OP SOLN: 1.0000 [drp] | Freq: Four times a day (QID) | OPHTHALMIC | Status: DC | PRN

## 2024-04-12 MED ORDER — OXYCODONE-ACETAMINOPHEN 5-325 MG PO TABS: 1.0000 | ORAL_TABLET | Freq: Four times a day (QID) | ORAL | 0 refills | Status: AC | PRN

## 2024-04-12 MED ORDER — MENTHOL 3 MG MT LOZG: 1.0000 | LOZENGE | OROMUCOSAL | Status: DC | PRN

## 2024-04-12 MED ORDER — BUPIVACAINE-EPINEPHRINE (PF) 0.25% -1:200000 IJ SOLN
INTRAMUSCULAR | Status: AC
  Filled 2024-04-12: qty 60

## 2024-04-12 MED ORDER — ACETAMINOPHEN 325 MG PO TABS: 650.0000 mg | ORAL_TABLET | ORAL | Status: DC | PRN

## 2024-04-12 MED ORDER — FENTANYL CITRATE (PF) 50 MCG/ML IJ SOSY
25.0000 ug | PREFILLED_SYRINGE | INTRAMUSCULAR | Status: DC | PRN
  Administered 2024-04-12 (×3): 25 ug via INTRAVENOUS

## 2024-04-12 MED ORDER — ENSURE PRE-SURGERY PO LIQD: 296.0000 mL | Freq: Once | ORAL | Status: DC

## 2024-04-12 MED ORDER — BUPIVACAINE LIPOSOME 1.3 % IJ SUSP: 20.0000 mL | Freq: Once | INTRAMUSCULAR | Status: DC

## 2024-04-12 MED ORDER — GLYCOPYRROLATE 0.2 MG/ML IJ SOLN
INTRAMUSCULAR | Status: DC | PRN
  Administered 2024-04-12: .2 mg via INTRAVENOUS

## 2024-04-12 MED ORDER — ACETAMINOPHEN 650 MG RE SUPP: 650.0000 mg | RECTAL | Status: DC | PRN

## 2024-04-12 MED ORDER — ENSURE PRE-SURGERY PO LIQD
592.0000 mL | Freq: Once | ORAL | Status: DC
  Filled 2024-04-12: qty 592

## 2024-04-12 MED ORDER — FENTANYL CITRATE (PF) 50 MCG/ML IJ SOSY
PREFILLED_SYRINGE | INTRAMUSCULAR | Status: AC
  Filled 2024-04-12: qty 3

## 2024-04-12 MED ORDER — PROPOFOL 10 MG/ML IV BOLUS
INTRAVENOUS | Status: DC | PRN
  Administered 2024-04-12: 120 mg via INTRAVENOUS

## 2024-04-12 MED ORDER — ONDANSETRON 4 MG PO TBDP: 4.0000 mg | ORAL_TABLET | Freq: Four times a day (QID) | ORAL | Status: DC | PRN

## 2024-04-12 MED ORDER — METHOCARBAMOL 500 MG PO TABS
500.0000 mg | ORAL_TABLET | Freq: Four times a day (QID) | ORAL | Status: DC | PRN
  Administered 2024-04-13: 500 mg via ORAL
  Filled 2024-04-12: qty 1

## 2024-04-12 MED ORDER — LACTATED RINGERS IR SOLN
Status: DC | PRN
  Administered 2024-04-12: 1000 mL

## 2024-04-12 MED ORDER — SUGAMMADEX SODIUM 200 MG/2ML IV SOLN
INTRAVENOUS | Status: DC | PRN
  Administered 2024-04-12: 300 mg via INTRAVENOUS

## 2024-04-12 MED ORDER — LIDOCAINE 2% (20 MG/ML) 5 ML SYRINGE
INTRAMUSCULAR | Status: DC | PRN
  Administered 2024-04-12: 60 mg via INTRAVENOUS

## 2024-04-12 MED ORDER — ONDANSETRON HCL 4 MG/2ML IJ SOLN
INTRAMUSCULAR | Status: DC | PRN
  Administered 2024-04-12: 4 mg via INTRAVENOUS

## 2024-04-12 MED ORDER — PANTOPRAZOLE SODIUM 40 MG PO TBEC
80.0000 mg | DELAYED_RELEASE_TABLET | Freq: Every day | ORAL | Status: DC
  Administered 2024-04-13: 80 mg via ORAL
  Filled 2024-04-12: qty 2

## 2024-04-12 MED ORDER — MIDAZOLAM HCL 2 MG/2ML IJ SOLN
INTRAMUSCULAR | Status: AC
Start: 2024-04-12 — End: 2024-04-12
  Filled 2024-04-12: qty 2

## 2024-04-12 MED ORDER — MAGNESIUM HYDROXIDE 400 MG/5ML PO SUSP
30.0000 mL | Freq: Every day | ORAL | Status: DC | PRN
Start: 2024-04-12 — End: 2024-04-13

## 2024-04-12 MED ORDER — SALINE SPRAY 0.65 % NA SOLN: 1.0000 | Freq: Four times a day (QID) | NASAL | Status: DC | PRN

## 2024-04-12 MED ORDER — VASOPRESSIN 20 UNIT/ML IV SOLN
INTRAVENOUS | Status: AC
  Filled 2024-04-12: qty 1

## 2024-04-12 MED ORDER — DIPHENHYDRAMINE HCL 50 MG/ML IJ SOLN: 12.5000 mg | Freq: Four times a day (QID) | INTRAMUSCULAR | Status: DC | PRN

## 2024-04-12 MED ORDER — DEXAMETHASONE SOD PHOSPHATE PF 10 MG/ML IJ SOLN
INTRAMUSCULAR | Status: DC | PRN
  Administered 2024-04-12: 10 mg via INTRAVENOUS

## 2024-04-12 MED ORDER — COLCHICINE 0.6 MG PO TABS: 0.6000 mg | ORAL_TABLET | Freq: Two times a day (BID) | ORAL | Status: DC | PRN

## 2024-04-12 MED ORDER — MAGIC MOUTHWASH: 15.0000 mL | Freq: Four times a day (QID) | ORAL | Status: DC | PRN

## 2024-04-12 MED ORDER — CHLORHEXIDINE GLUCONATE 0.12 % MT SOLN
15.0000 mL | Freq: Once | OROMUCOSAL | Status: AC
  Administered 2024-04-12: 15 mL via OROMUCOSAL

## 2024-04-12 MED ORDER — DEXMEDETOMIDINE HCL IN NACL 80 MCG/20ML IV SOLN
INTRAVENOUS | Status: DC | PRN
  Administered 2024-04-12: 10 ug via INTRAVENOUS

## 2024-04-12 MED ORDER — SUGAMMADEX SODIUM 200 MG/2ML IV SOLN
INTRAVENOUS | Status: AC
  Filled 2024-04-12: qty 2

## 2024-04-12 MED ORDER — LACTATED RINGERS IV SOLN: INTRAVENOUS | Status: DC

## 2024-04-12 MED ORDER — BUPIVACAINE-EPINEPHRINE 0.25% -1:200000 IJ SOLN
INTRAMUSCULAR | Status: DC | PRN
  Administered 2024-04-12: 60 mL

## 2024-04-12 MED ORDER — CYCLOBENZAPRINE HCL 5 MG PO TABS: 5.0000 mg | ORAL_TABLET | Freq: Three times a day (TID) | ORAL | 2 refills | Status: AC | PRN

## 2024-04-12 SURGICAL SUPPLY — 67 items
APPLICATOR COTTON TIP 6 STRL (MISCELLANEOUS) ×4 IMPLANT
BAG COUNTER SPONGE SURGICOUNT (BAG) ×2 IMPLANT
BINDER ABD UNIV 12 45-62 (WOUND CARE) IMPLANT
BLADE SURG SZ11 CARB STEEL (BLADE) ×2 IMPLANT
CHLORAPREP W/TINT 26 (MISCELLANEOUS) ×2 IMPLANT
CLIP APPLIE 5 13 M/L LIGAMAX5 (MISCELLANEOUS) IMPLANT
COVER SURGICAL LIGHT HANDLE (MISCELLANEOUS) ×2 IMPLANT
COVER TIP SHEARS 8 DVNC (MISCELLANEOUS) ×2 IMPLANT
DEVICE SECURE STRAP 25 ABSORB (INSTRUMENTS) IMPLANT
DEVICE TROCAR PUNCTURE CLOSURE (ENDOMECHANICALS) IMPLANT
DRAIN CHANNEL 19F RND (DRAIN) IMPLANT
DRAPE ARM DVNC X/XI (DISPOSABLE) ×8 IMPLANT
DRAPE COLUMN DVNC XI (DISPOSABLE) ×2 IMPLANT
DRIVER NDL LRG 8 DVNC XI (INSTRUMENTS) ×4 IMPLANT
DRIVER NDL MEGA SUTCUT DVNCXI (INSTRUMENTS) ×4 IMPLANT
DRIVER NDLE LRG 8 DVNC XI (INSTRUMENTS) ×4 IMPLANT
DRIVER NDLE MEGA SUTCUT DVNCXI (INSTRUMENTS) ×4 IMPLANT
DRSG OPSITE POSTOP 4X6 (GAUZE/BANDAGES/DRESSINGS) IMPLANT
DRSG OPSITE POSTOP 4X8 (GAUZE/BANDAGES/DRESSINGS) IMPLANT
DRSG TEGADERM 2-3/8X2-3/4 SM (GAUZE/BANDAGES/DRESSINGS) ×10 IMPLANT
ELECT REM PT RETURN 15FT ADLT (MISCELLANEOUS) ×2 IMPLANT
EVACUATOR DRAINAGE 10X20 100CC (DRAIN) IMPLANT
EVACUATOR SILICONE 100CC (DRAIN) IMPLANT
FORCEPS PROGRASP DVNC XI (FORCEP) IMPLANT
GAUZE SPONGE 2X2 8PLY STRL LF (GAUZE/BANDAGES/DRESSINGS) ×2 IMPLANT
GLOVE ECLIPSE 8.0 STRL XLNG CF (GLOVE) ×4 IMPLANT
GLOVE INDICATOR 8.0 STRL GRN (GLOVE) ×4 IMPLANT
GOWN STRL REUS W/ TWL XL LVL3 (GOWN DISPOSABLE) ×4 IMPLANT
GRASPER SUT TROCAR 14GX15 (MISCELLANEOUS) IMPLANT
GRASPER TIP-UP FEN DVNC XI (INSTRUMENTS) ×2 IMPLANT
IRRIGATION SUCT STRKRFLW 2 WTP (MISCELLANEOUS) IMPLANT
KIT BASIN OR (CUSTOM PROCEDURE TRAY) ×2 IMPLANT
KIT TURNOVER KIT A (KITS) ×2 IMPLANT
MESH HERNIA 6X6 BARD (Mesh General) IMPLANT
MESH VENTRALIGHT ST 8X10 (Mesh General) IMPLANT
NDL HYPO 22X1.5 SAFETY MO (MISCELLANEOUS) ×2 IMPLANT
NDL INSUFFLATION 14GA 120MM (NEEDLE) ×2 IMPLANT
NDL SPNL 22GX3.5 QUINCKE BK (NEEDLE) IMPLANT
NEEDLE HYPO 22X1.5 SAFETY MO (MISCELLANEOUS) ×2 IMPLANT
NEEDLE INSUFFLATION 14GA 120MM (NEEDLE) ×2 IMPLANT
NEEDLE SPNL 22GX3.5 QUINCKE BK (NEEDLE) ×2 IMPLANT
PACK CARDIOVASCULAR III (CUSTOM PROCEDURE TRAY) ×2 IMPLANT
PAD POSITIONING PINK XL (MISCELLANEOUS) ×2 IMPLANT
PENCIL SMOKE EVACUATOR (MISCELLANEOUS) IMPLANT
SCISSORS LAP 5X45 EPIX DISP (ENDOMECHANICALS) IMPLANT
SCISSORS MNPLR CVD DVNC XI (INSTRUMENTS) ×2 IMPLANT
SEAL UNIV 5-12 XI (MISCELLANEOUS) ×8 IMPLANT
SEALER VESSEL EXT DVNC XI (MISCELLANEOUS) IMPLANT
SOLUTION ELECTROSURG ANTI STCK (MISCELLANEOUS) ×2 IMPLANT
SPIKE FLUID TRANSFER (MISCELLANEOUS) ×2 IMPLANT
STRIP CLOSURE SKIN 1/2X4 (GAUZE/BANDAGES/DRESSINGS) IMPLANT
SUT ETHIBOND 0 36 GRN (SUTURE) IMPLANT
SUT ETHIBOND NAB CT1 #1 30IN (SUTURE) IMPLANT
SUT MNCRL AB 4-0 PS2 18 (SUTURE) ×2 IMPLANT
SUT PDS AB 1 CT1 27 (SUTURE) ×12 IMPLANT
SUT PROLENE 2 0 SH DA (SUTURE) IMPLANT
SUT STRATA 2-0 15 CT-1.5 (SUTURE) IMPLANT
SUT STRATAFIX SPIRAL PDS3-0 (SUTURE) IMPLANT
SUT VIC AB 3-0 SH 27XBRD (SUTURE) IMPLANT
SUT VICRYL 0 UR6 27IN ABS (SUTURE) IMPLANT
SYR 10ML LL (SYRINGE) ×2 IMPLANT
SYR 20ML LL LF (SYRINGE) ×2 IMPLANT
TOWEL OR 17X26 10 PK STRL BLUE (TOWEL DISPOSABLE) ×2 IMPLANT
TRAY FOLEY MTR SLVR 14FR STAT (SET/KITS/TRAYS/PACK) IMPLANT
TRAY FOLEY MTR SLVR 16FR STAT (SET/KITS/TRAYS/PACK) IMPLANT
TROCAR ADV FIXATION 5X100MM (TROCAR) IMPLANT
TUBING INSUFFLATION 10FT LAP (TUBING) ×2 IMPLANT

## 2024-04-12 NOTE — H&P (Signed)
04/12/2024   PATIENT NAME: Thomas Salinas MRN: [id number] DOB: May 12, 1976 PHYSICIANS:  REFERRING PHYSICIAN: Kip Beverley RAMAN, MD  CARE TEAM:  Patient Care Team: Kennyth Worth HERO, MD as PCP - General (Family Medicine)  CONSULTING PROVIDER: ELSPETH JUDAH SCHULTZE, MD  SUBJECTIVE   Chief Complaint: New Consultation (Hernia)   Thomas Salinas is a 48 y.o. male  who is seen today as an office consultation  at the request of DrRONITA Kip  for evaluation of periumbilical hernias.  History of Present Illness:  48 year old male. History of Down syndrome. . I had seen him prior to that and did a 25 cm physio mesh hernia repair in 2011 for supraumbilical hernias. Seemed to recover uneventfully. Had biliary dyskinesia and chronic cholecystitis requiring a laparoscopic cholecystectomy by Dr. Rubin in April 2014. Looks like he placed a port through the mesh and repaired it with Vicryl. More recently patient readily noticed some bulging around his umbilical incision. Saw primary care. Ultrasound ordered that raise concern of fat-containing hernias supra and periumbilically.. Surgical consultation requested.   Patient works at [employer]. Used to compete in [employer] and moderately active. Here with his parents. I think they have known about a periumbilical mass for several years. However patient notes its gotten larger. Is more noticeable on a trip down [place] when he was riding rides and the polyp would push on his bellybutton and cause discomfort. He remains moderately active. He has gained some weight. Does not smoke. Moves his bowels a couple times a day. Can walk 20 minutes without much difficulty. Has not had any abdominal surgeries since his cholecystectomy in 2000  Medical History:  Past Medical History:  Diagnosis Date  Down syndrome (HHS-HCC)  Fatty liver   There is no problem list on file for this patient.  Past Surgical History:  Procedure Laterality Date  MYRINGOTOMY  05/26/1987  CHOLECYSTECTOMY 09/12/2012  HERNIA REPAIR  TONSILLECTOMY AND ADENOIDECTOMY    No Known Allergies  Current Outpatient Medications on File Prior to Visit  Medication Sig Dispense Refill  allopurinoL  (ZYLOPRIM ) 100 MG tablet Take 100 mg by mouth once daily  omeprazole (PRILOSEC) 40 MG DR capsule Take 40 mg by mouth 2 (two) times daily   No current facility-administered medications on file prior to visit.   Family History  Problem Relation Age of Onset  Hyperlipidemia (Elevated cholesterol) Mother  High blood pressure (Hypertension) Mother  Heart disease Father  Hearing loss Father  Hyperlipidemia (Elevated cholesterol) Father  Hearing loss Brother  High blood pressure (Hypertension) Brother  Asthma Brother    Social History   Tobacco Use  Smoking Status Never  Smokeless Tobacco Never    Social History   Socioeconomic History  Marital status: Single  Tobacco Use  Smoking status: Never  Smokeless tobacco: Never  Vaping Use  Vaping status: Never Used  Substance and Sexual Activity  Alcohol use: Never  Drug use: Never  Sexual activity: Defer   Social Drivers of Health   Financial Resource Strain: Low Risk (07/15/2023)  Received from [facility]  Overall Financial Resource Strain (CARDIA)  Difficulty of Paying Living Expenses: Not very hard  Food Insecurity: No Food Insecurity (07/15/2023)  Received from [facility]  Hunger Vital Sign  Within the past 12 months, you worried that your food would run out before you got the money to buy more.: Never true  Within the past 12 months, the food you bought just didn't last and you didn't have money to get more.: Never  true  Transportation Needs: No Transportation Needs (07/15/2023)  Received from [facility]  PRAPARE - Transportation  Lack of Transportation (Medical): No  Lack of Transportation (Non-Medical): No  Physical Activity: Insufficiently Active (07/15/2023)  Received from [facility]   Exercise Vital Sign  On average, how many days per week do you engage in moderate to strenuous exercise (like a brisk walk)?: 2 days  On average, how many minutes do you engage in exercise at this level?: 60 min  Stress: No Stress Concern Present (07/15/2023)  Received from [facility] - Occupational Stress Questionnaire  Feeling of Stress : Only a little  Social Connections: Moderately Integrated (07/15/2023)  [facility]  Social Network  How would you rate your social network (family, work, friends)?: Adequate participation with social networks  Housing Stability: Low Risk (07/15/2023)  Received from [facility] Stability Vital Sign  In the last 12 months, was there a time when you were not able to pay the mortgage or rent on time?: No  In the past 12 months, how many times have you moved where you were living?: 0  At any time in the past 12 months, were you homeless or living in a shelter (including now)?: No   ############################################################  Review of Systems: A complete review of systems (ROS) was obtained from the patient.  We have reviewed this information and discussed as appropriate with the patient.  See HPI as well for other pertinent ROS.  Constitutional: No fevers, chills, sweats. Weight stable Eyes: No vision changes, No discharge HENT: No sore throats, nasal drainage Lymph: No neck swelling, No bruising easily Pulmonary: No cough, productive sputum CV: No orthopnea, PND . No exertional chest/neck/shoulder/arm pain. Patient can walk 20 minutes without difficulty.   GI: No personal nor family history of GI/colon cancer, inflammatory bowel disease, irritable bowel syndrome, allergy such as Celiac Sprue, dietary/dairy problems, colitis, ulcers nor gastritis. No recent sick contacts/gastroenteritis. No travel outside the country. No changes in diet.  Renal: No UTIs, No  hematuria Genital: No drainage, bleeding, masses Musculoskeletal: No severe joint pain. Good ROM major joints Skin: No sores or lesions Heme/Lymph: No easy bleeding. No swollen lymph nodes Neuro: No active seizures. No facial droop Psych: No hallucinations. No agitation  OBJECTIVE   Vitals:  02/21/24 1340  BP: (!) 86/58  Pulse: 86  Temp: 36.8 C (98.2 F)  TempSrc: Temporal  SpO2: 97%  Weight: 76.7 kg (169 lb)  Height: 152.4 cm (5')  PainSc: 0-No pain   Body mass index is 33.01 kg/m.  PHYSICAL EXAM:  Constitutional: Not cachectic. Hygeine adequate. Vitals signs as above. Downs facial features  Eyes: No glasses. Vision adequate,Pupils reactive, normal extraocular movements. Sclera nonicteric Neuro: CN II-XII intact. No major focal sensory defects. No major motor deficits. Lymph: No head/neck/groin lymphadenopathy Psych: No severe agitation. No severe anxiety. Judgment & insight Adequate, Oriented x4, HENT: Normocephalic, Mucus membranes moist. No thrush. Hearing: adequate Neck: Supple, No tracheal deviation. No obvious thyromegaly Chest: No pain to chest wall compression. Good respiratory excursion. No audible wheezing CV: Pulses intact. regular. No major extremity edema Ext: No obvious deformity or contracture. Edema: Not present. No cyanosis Skin: No major subcutaneous nodules. Warm and dry Musculoskeletal: Severe joint rigidity not present. No obvious clubbing. No digital petechiae. Mobility: no assist device moving easily without restrictions  Abdomen: Obese Soft. Nondistended. Nontender. Hernia: Present at: Primarily supraumbilical periumbilical, size 4x4cm. Diastasis recti: Large supraumbilical midline. No  hepatomegaly. No splenomegaly.  Genital/Pelvic: Inguinal hernia: right inguinal hernia. Left with more subtle inguinal hernia.  Inguinal lymph nodes: without lymphadenopathy nor hidradenitis.   Rectal: (Deferred)  PE Chaperone note: Russell Christine, CMA, was  included in the room as chaperone for sensitive portions of the exam   ###################################################################  Labs, Imaging and Diagnostic Testing:  Located in 'Care Everywhere' section of Epic EMR chart  PRIOR [facility] CLINIC NOTES:  Located in 'Care Everywhere' section of Epic EMR chart  SURGERY NOTES:  Located in 'Care Everywhere' section of Epic EMR chart  PATHOLOGY:  Located in 'Care Everywhere' section of Epic EMR chart  Assessment and Plan:  DIAGNOSES:  There are no diagnoses linked to this encounter.   ASSESSMENT/PLAN  Pleasant obese male with prior supraumbilical hernia repairs with mesh with followed up with cholecystectomy periumbilically with periumbilical recurrent hernia. Probable right inguinal hernia on exam. Possible left inguinal as well. CT scan notes periumbilical and definite right inguinal hernias.   Think he would benefit from exploration and repair of hernias found. Given the numerous prior abdominal surgeries with old mesh, most likely will enter in subcostally and proceed with a robotic modified Tapp repair. See if we need to takedown the old mesh which essentially patch the defect if it is in the middle of it. Hard to say. TAPP below the old periumbilical mesh. Need Bard mesh in the groins. Will see.   The anatomy & physiology of the abdominal wall was discussed. The pathophysiology of hernias was discussed. Natural history risks without surgery including progeressive enlargement, pain, incarceration, & strangulation was discussed. Contributors to complications such as smoking, obesity, diabetes, prior surgery, etc were discussed.   I feel the risks of no intervention will lead to serious problems that outweigh the operative risks; therefore, I recommended surgery to reduce and repair the hernia. I explained minimally invasive approaches such as laparoscopy or robotics with possible need for an open approach. I noted the probable  use of mesh to patch and/or buttress the hernia repair  Risks such as bleeding, infection, abscess, need for further treatment, injury to other organs, need for repair of tissues / organs, stroke, heart attack, death, and other risks were discussed. I noted a good likelihood this will help address the problem. Goals of post-operative recovery were discussed as well. Possibility that this will not correct all symptoms was explained. I stressed the importance of low-impact activity, aggressive pain control, avoiding constipation, & not pushing through pain to minimize risk of post-operative chronic pain or injury. Possibility of reherniation especially with smoking, obesity, diabetes, immunosuppression, and other health conditions was discussed. We will work to minimize complications.   An educational handout further explaining the pathology & treatment options was given as well. Questions were answered. The patient expresses understanding & wishes to proceed with surgery.   Elspeth KYM Schultze, MD, FACS, MASCRS Esophageal, Gastrointestinal & Colorectal Surgery Robotic and Minimally Invasive Surgery  [facility] [address] [place], [place] [address] [phone] Fax [phone] Main  CONTACT INFORMATION: Weekday (9AM-5PM): Call [facility] main office at [phone] Weeknight (5PM-9AM) or Weekend/Holiday: Check EPIC Web Links tab & use AMION (password  [email or web address]) for General Surgery [facility] coverage  Please, DO NOT use SecureChat  (it is not reliable communication to reach operating surgeons & will lead to a delay in care).   Epic staff messaging available for outpatient concerns needing 1-2 business day response.  04/12/2024   

## 2024-04-12 NOTE — Transfer of Care (Signed)
 Immediate Anesthesia Transfer of Care Note  Patient: Thomas Salinas  Procedure(s) Performed: REPAIR, HERNIA, VENTRAL, ROBOT-ASSISTED (Abdomen) REPAIR, HERNIA, INGUINAL, ROBOT-ASSISTED, LAPAROSCOPIC, USING MESH (Right: Abdomen)  Patient Location: PACU  Anesthesia Type:General  Level of Consciousness: drowsy and patient cooperative  Airway & Oxygen Therapy: Patient Spontanous Breathing and Patient connected to nasal cannula oxygen  Post-op Assessment: Report given to RN and Post -op Vital signs reviewed and stable  Post vital signs: Reviewed and stable  Last Vitals:  Vitals Value Taken Time  BP 136/80 04/12/24 16:23  Temp    Pulse 94 04/12/24 16:28  Resp 32 04/12/24 16:28  SpO2 86 % 04/12/24 16:28  Vitals shown include unfiled device data.  Last Pain:  Vitals:   04/12/24 1117  TempSrc:   PainSc: 0-No pain         Complications: No notable events documented.

## 2024-04-12 NOTE — Op Note (Signed)
 04/12/2024  PATIENT:  Thomas Salinas  48 y.o. male  Patient Care Team: Kip Righter, MD as PCP - General (Family Medicine) Sheldon Standing, MD as Consulting Physician (General Surgery) Rubin Calamity, MD as Consulting Physician (General Surgery)  PRE-OPERATIVE DIAGNOSIS:  VENTRAL INCISIONAL RECURRENT ABDOMINAL WALL HERNIAE, RIGHT INGUINAL HERNIA. PROBABLE LEFT INGUINAL HERNIA  POST-OPERATIVE DIAGNOSIS:   VENTRAL INCISIONAL RECURRENT ABDOMINAL WALL HERNIAE BILATERAL INGUINAL HERNIAS  PROCEDURE:   ROBOTIC BILATERAL INGUINAL HERNIA REPAIRS WITH MESH  LAPAROSCOPIC REPAIR OF  INCISIONAL RECURRENT ABDOMINAL WALL HERNIA WITH MESH ROBOTIC LYSIS OF ADHESIONS X 150 MIN (2/3 CASE) TRANSVERSUS ABDOMINIS PLANE (TAP) BLOCK - BILATERAL  SURGEON:  Standing KYM Sheldon, MD  ASSISTANT:  (n/a)   ANESTHESIA:  General endotracheal intubation anesthesia (GETA) and Regional TRANSVERSUS ABDOMINIS PLANE (TAP) nerve block -BILATERAL for perioperative & postoperative pain control at the level of the transverse abdominis & preperitoneal spaces along the flank at the anterior axillary line, from subcostal ridge to iliac crest under laparoscopic guidance provided with 60 mL of bupivicaine 0.25% with epinephrine   Estimated Blood Loss (EBL):   Total I/O In: 550 [I.V.:300; IV Piggyback:250] Out: 150 [Blood:150].   (See anesthesia record)  Delay start of Pharmacological VTE agent (>24hrs) due to concerns of significant anemia, surgical blood loss, or risk of bleeding?:  no  DRAINS: (None)  SPECIMEN:  (no specimen)  DISPOSITION OF SPECIMEN:  (not applicable)  COUNTS:  Sponge, needle, & instrument counts CORRECT at the conclusion of the case.      PLAN OF CARE: Discharge to home after PACU  PATIENT DISPOSITION:  PACU - hemodynamically stable.  INDICATION: Pleasant patient with Down syndrome.  Prior hernia repair by me in 2011 with mesh.  Required reoperation by different surgeon who developed hernia within the  old mesh periumbilically given his obesity.  Also had definite right and probable left inguinal hernias.  Recommendation was made for surgical repair  The anatomy & physiology of the abdominal wall was discussed. The pathophysiology of hernias was discussed. Natural history risks without surgery including progeressive enlargement, pain, incarceration & strangulation was discussed. Contributors to complications such as smoking, obesity, diabetes, prior surgery, etc were discussed.  I feel the risks of no intervention will lead to serious problems that outweigh the operative risks; therefore, I recommended surgery to reduce and repair the hernia. I explained laparoscopic techniques with possible need for an open approach. I noted the probable use of mesh to patch and/or buttress the hernia repair.  Risks such as bleeding, infection, abscess, need for further treatment, heart attack, death, and other risks were discussed. I noted a good likelihood this will help address the problem. Goals of post-operative recovery were discussed as well. Possibility that this will not correct all symptoms was explained. I stressed the importance of low-impact activity, aggressive pain control, avoiding constipation, & not pushing through pain to minimize risk of post-operative chronic pain or injury. Possibility of reherniation especially with smoking, obesity, diabetes, immunosuppression, and other health conditions was discussed. We will work to minimize complications.   An educational handout further explaining the pathology & treatment options was given as well. Questions were answered. The patient expresses understanding & wishes to proceed with surgery.   OR FINDINGS: Very dense omental adhesions to old mesh.  Periumbilical incisional hernia with some breakdown centrally as well.  10 x 5 cm region.  Type of ventral wall repair:  Laparoscopic underlay repair .without Primary repair of largest hernia Placement of  mesh: Superiorly intraperitoneal with  inferior half tucked into lower abdominal and suprapubic RECTRORECTUS space Name of mesh: Bard Ventralight dual sided (polypropylene / Seprafilm) Size of mesh: 25x20cm Orientation: Transverse Mesh overlap:  7cm    DESCRIPTION:   Informed consent was confirmed.  The patient underwent general anaesthesia without difficulty.  The patient was positioned appropriately.  VTE prevention in place.  The patient's abdomen was clipped, prepped, & draped in a sterile fashion.  Surgical timeout confirmed our plan.  Peritoneal entry with a laparoscopic port was obtained using Varess spring needle entry technique in the left upper abdomen as the patient was positioned in reverse Trendelenburg.  I induced carbon dioxide insufflation.  No change in end tidal CO2 measurements.  Full symmetrical abdominal distention.  Initial port was carefully placed.  Camera inspection revealed no injury.  Did free some upper omental adhesions to allow port placement along the bilateral subcostal ridges.  Extra ports were carefully placed under direct laparoscopic visualization. Robot carefully docked.  Focused on lyse adhesions to free the omentum to the anterior abdominal wall old mesh.  This took some time to be safe.  Fortunately, was only omentum.  Quite inflamed and stuck.  Gradually reduced omentum and a periumbilical obvious hernia.  The old mesh was very thin and somewhat splayed out with Swiss cheese like central hernias as well.  I then focused on the inguinal hernias.  Did a transabdominal peritoneal approach.  Scored the infraumbilical peritoneum transversely just inferior to the lowest part of the periumbilical old mesh.  I used careful sharp dissection to get into the retrorectus space centrally and peritoneum more laterally on both sides.  I focused attention on the RIGHT pelvis since that was the dominant hernia side.   I used blunt & focused sharp dissection to free the  peritoneum off the flank and down to the pubic rim.  I freed the anteriolateral bladder wall off the anteriolateral pelvic wall, sparing midline attachments.   I located a swath of peritoneum going into a hernia fascial defect at the  direct space as well as internal ring consistent with pantaloon type double direct/indirect inguinal hernia...  I gradually freed the peritoneal hernia sac off safely and reduced it into the preperitoneal space.  I freed the peritoneum off the spermatic vessels & vas deferens.  I freed peritoneum off the retroperitoneum along the psoas muscle.  Spermatic cord lipoma was dissected away & removed.  I checked & assured hemostasis.  No obvious femoral nor obturator hernias  I turned attention on the opposite  LEFT pelvis.  I did dissection in a similar, mirror-image fashion. The patient had a direct space inguinal hernia.SABRA    Spermatic cord lipoma was dissected away & removed.    I checked & assured hemostasis.     Given his obesity with breakdown of light weight polypropylene mesh, I chose sheets of medium-weight polypropylene Bard Marlex 15x15cm, one for each side.  I cut a single sigmoid-shaped slit ~6cm from a corner of each mesh.  I placed the meshes into the preperitoneal space & laid them as overlapping diamonds such that at the inferior points, a 6x6 cm corner flap rested in the true anterolateral pelvis, covering the obturator & femoral foramina.   I allowed the bladder to return to the pubis, this helping tuck the corners of the mesh in the anteriolateral pelvis.  The medial corners overlapped each other across midline cephalad to the pubic rim.   Given the numerous hernias of moderate size, I placed a  third 15x15cm mesh in the center as a vertical diamond.  The lateral wings of the mesh overlap across the direct spaces and internal rings where the dominant hernias were.  This provided good coverage and reinforcement of the hernia repairs.  I did place a few tacks to help  hold the mesh in bilateral lower quadrant and suprapubic region.  Used secure strap absorbable tacks.  We then turned attention back to the recurrent incisional hernia.  Complete the rest the case laparoscopically.  I made sure hemostasis was good.  I mapped out the region using a needle passer.   To ensure that I would have at least 5 cm radial coverage outside of the hernia defect, I chose a 25x20cm dual sided mesh.  I placed #1 PDS stitches around its edge about every 5 cm = 8 total.  I rolled the mesh & placed into the peritoneal cavity through the right subcostal 12 mm port site.  I unrolled the mesh and positioned it appropriately.  I secured the mesh to cover up the hernia defect using a laparoscopic suture passer to pass the tails of the PDS through the abdominal wall & tagged them with clamps for good transfascial suturing.  I started out in four corners to make sure I had the mesh centered under the hernia defect appropriately, and then proceeded to work in quadrants.    We evacuated CO2 & desufflated the abdomen.  I tied the fascial stitches down. I closed the fascial defect that I placed the mesh through using #1 PDS interrupted transverse stitches primarily.  I reinsufflated the abdomen. The mesh provided at least circumferential coverage around the entire region of hernia defects.  I secured the mesh centrally with an additional trans fascial stitch in & out the mesh using under laparoscopic visualization I used these stitches to also tack the transverse peritoneal flap to the anterior abdominal wall as well.  I then tacked the peritoneum to the anterior abdominal wall and made sure there was no exposed mesh.   I tacked the edges & central part of the mesh to the peritoneum/posterior rectus fascia with SecureStrap absorbable tacks.   Inspection reviewed some omental pieces and did irrigation and confirmed good hemostasis.  No bowel injury or nor other concerns.  I completed a broad field block of  local anesthesia at fascial stitch sites & fascial closure areas.    Capnoperitoneum was evacuated. Ports were removed. The skin was closed with Monocryl at the port sites and Steri-Strips on the fascial stitch puncture sites.  Patient is being extubated to go to the recovery room.  I discussed operative findings, updated the patient's status, discussed probable steps to recovery, and gave postoperative recommendations to the patient's father, Thomas Salinas..  Recommendations were made.  Questions were answered.  He expressed understanding & appreciation.  Elspeth KYM Schultze, M.D., F.A.C.S. Gastrointestinal and Minimally Invasive Surgery [facility] [address], [address] [place], [place] [address] [phone] Main / Paging  04/12/2024 4:12 PM

## 2024-04-12 NOTE — Anesthesia Procedure Notes (Signed)
 Procedure Name: Intubation Date/Time: 04/12/2024 1:32 PM  Performed by: Nanci Riis, CRNAPre-anesthesia Checklist: Emergency Drugs available, Patient identified, Timeout performed, Suction available and Patient being monitored Patient Re-evaluated:Patient Re-evaluated prior to induction Oxygen Delivery Method: Circle system utilized Preoxygenation: Pre-oxygenation with 100% oxygen Induction Type: IV induction Ventilation: Mask ventilation without difficulty Laryngoscope Size: Miller and 3 Grade View: Grade I Tube type: Oral Tube size: 7.5 mm Number of attempts: 1 Airway Equipment and Method: Stylet Placement Confirmation: ETT inserted through vocal cords under direct vision, positive ETCO2 and breath sounds checked- equal and bilateral Secured at: 21 cm Tube secured with: Tape Dental Injury: Teeth and Oropharynx as per pre-operative assessment

## 2024-04-12 NOTE — Anesthesia Preprocedure Evaluation (Addendum)
 Anesthesia Evaluation  Patient identified by MRN, date of birth, ID band Patient awake    Reviewed: Allergy & Precautions, NPO status , Patient's Chart, lab work & pertinent test results  Airway Mallampati: IV  TM Distance: >3 FB Neck ROM: Full    Dental  (+) Teeth Intact, Dental Advisory Given   Pulmonary neg pulmonary ROS   breath sounds clear to auscultation       Cardiovascular negative cardio ROS  Rhythm:Regular Rate:Normal     Neuro/Psych negative neurological ROS  negative psych ROS   GI/Hepatic Neg liver ROS,GERD  Medicated,,  Endo/Other  negative endocrine ROS    Renal/GU negative Renal ROS     Musculoskeletal  (+) Arthritis ,    Abdominal   Peds  Hematology negative hematology ROS (+)   Anesthesia Other Findings   Reproductive/Obstetrics                              Anesthesia Physical Anesthesia Plan  ASA: 3  Anesthesia Plan: General   Post-op Pain Management: Tylenol  PO (pre-op)* and Toradol IV (intra-op)*   Induction: Intravenous  PONV Risk Score and Plan: 3 and Ondansetron , Dexamethasone  and Midazolam   Airway Management Planned: Oral ETT and Video Laryngoscope Planned  Additional Equipment: None  Intra-op Plan:   Post-operative Plan: Extubation in OR  Informed Consent: I have reviewed the patients History and Physical, chart, labs and discussed the procedure including the risks, benefits and alternatives for the proposed anesthesia with the patient or authorized representative who has indicated his/her understanding and acceptance.     Dental advisory given  Plan Discussed with: CRNA  Anesthesia Plan Comments:          Anesthesia Quick Evaluation

## 2024-04-12 NOTE — Progress Notes (Signed)
   04/12/24 1910  Vitals  Temp 97.6 F (36.4 C)  Temp Source Oral  BP 91/71  MAP (mmHg) 77  BP Location Right Arm  BP Method Automatic  Patient Position (if appropriate) Sitting  Pulse Rate 86  Pulse Rate Source Monitor  Resp 18  Level of Consciousness  Level of Consciousness Alert  MEWS COLOR  MEWS Score Color Green  Oxygen Therapy  SpO2 96 %  O2 Device Room Air  Patient Activity (if Appropriate) In bed  Pulse Oximetry Type Continuous  MEWS Score  MEWS Temp 0  MEWS Systolic 1  MEWS Pulse 0  MEWS RR 0  MEWS LOC 0  MEWS Score 1   Added LOC to VS to bring MEWS from yellow to green. Will continue to monitor BP.

## 2024-04-12 NOTE — Interval H&P Note (Signed)
 History and Physical Interval Note:  04/12/2024 12:14 PM  Thomas Salinas  has presented today for surgery, with the diagnosis of VENTRAL INCISIONAL RECURRENT ABDOMINAL WALL HERNIAE, RIGHT INGUINAL HERNIA. PROBABLE LEFT INGUINAL HERNIA.  The various methods of treatment have been discussed with the patient and family. After consideration of risks, benefits and other options for treatment, the patient has consented to  Procedure(s) with comments: REPAIR, HERNIA, VENTRAL, ROBOT-ASSISTED (N/A) REPAIR, HERNIA, INGUINAL, ROBOT-ASSISTED, LAPAROSCOPIC, USING MESH (Right) - possible left as a surgical intervention.  The patient's history has been reviewed, patient examined, no change in status, stable for surgery.  I have reviewed the patient's chart and labs.  Questions were answered to the patient's satisfaction.    I have re-reviewed the the patient's records, history, medications, and allergies.  I have re-examined the patient.  I again discussed intraoperative plans and goals of post-operative recovery.  The patient agrees to proceed.  Thomas Salinas  September 16, 1975 [id number]  Patient Care Team: Kip Righter, MD as PCP - General (Family Medicine)  Patient Active Problem List   Diagnosis Date Noted   Ventral hernia 12/25/2021   Dyslipidemia 06/02/2021   Hyperglycemia 06/02/2021   Poor dentition 05/30/2021   Down syndrome 07/28/2012   Gout 07/28/2012    Past Medical History:  Diagnosis Date   Arthritis    Gout   Down's syndrome    Fatty liver    GERD (gastroesophageal reflux disease)    History of repair of congenital atrial septal defect (ASD) 1983   and Mitral valve repaired at the same time,  [place]    History of strabismus surgery    Pre-diabetes     Past Surgical History:  Procedure Laterality Date   CARDIAC SURGERY  1983   ASD repair, mitral valve replacement   CHOLECYSTECTOMY N/A 09/12/2012   Dr Rubin   EYE SURGERY  05/26/1979   strabismus   LAPAROSCOPIC ASSISTED  VENTRAL HERNIA REPAIR  03/28/2010   lap VWH w 25x20cm mesh supraumb   MYRINGOTOMY  05/26/1987   left ear  with tympanostomy   TONSILLECTOMY AND ADENOIDECTOMY      Social History   Socioeconomic History   Marital status: Single    Spouse name: Not on file   Number of children: Not on file   Years of education: Not on file   Highest education level: Not on file  Occupational History   Not on file  Tobacco Use   Smoking status: Never   Smokeless tobacco: Never  Vaping Use   Vaping status: Never Used  Substance and Sexual Activity   Alcohol use: No   Drug use: No   Sexual activity: Never  Other Topics Concern   Not on file  Social History Narrative   Not on file   Social Drivers of Health   Financial Resource Strain: Low Risk  (07/15/2023)   Received from [facility]   Overall Financial Resource Strain (CARDIA)    Difficulty of Paying Living Expenses: Not very hard  Food Insecurity: No Food Insecurity (07/15/2023)   [facility] from [facility]   Hunger Vital Sign    Within the past 12 months, you worried that your food would run out before you got the money to buy more.: Never true    Within the past 12 months, the food you bought just didn't last and you didn't have money to get more.: Never true  Transportation Needs: No Transportation Needs (07/15/2023)   Received from [facility]   PRAPARE -  Administrator, Civil Service (Medical): No    Lack of Transportation (Non-Medical): No  Physical Activity: Insufficiently Active (07/15/2023)   Received from [facility]   Exercise Vital Sign    On average, how many days per week do you engage in moderate to strenuous exercise (like a brisk walk)?: 2 days    On average, how many minutes do you engage in exercise at this level?: 60 min  Stress: No Stress Concern Present (07/15/2023)   Received from [facility] - Occupational Stress Questionnaire    Feeling of Stress :  Only a little  Social Connections: Moderately Integrated (07/15/2023)   Received from [facility]   Social Network    How would you rate your social network (family, work, friends)?: Adequate participation with social networks  Intimate Partner Violence: Not At Risk (07/15/2023)   Received from [facility]   HITS    Over the last 12 months how often did your partner physically hurt you?: Never    Over the last 12 months how often did your partner insult you or talk down to you?: Never    Over the last 12 months how often did your partner threaten you with physical harm?: Never    Over the last 12 months how often did your partner scream or curse at you?: Never    Family History  Problem Relation Age of Onset   Hyperlipidemia Mother    Hypertension Mother    Hyperlipidemia Father    Heart disease Father    Hearing loss Father    Hypertension Brother    Hearing loss Brother    Asthma Brother    Hearing loss Brother     Medications Prior to Admission  Medication Sig Dispense Refill Last Dose/Taking   allopurinol  (ZYLOPRIM ) 100 MG tablet TAKE ONE TABLET BY MOUTH DAILY 90 tablet 3 04/12/2024 at  9:15 AM   colchicine  0.6 MG tablet Today: Take 2 tablet by mouth once. Take an additional tablet 1 hour later. Day 2 on: Take 1 tab PO BID for up to 7 days. (Patient taking differently: Take 0.6-1.2 mg by mouth 2 (two) times daily as needed (gout flair). Today: Take 2 tablet by mouth once. Take an additional tablet 1 hour later. Day 2 on: Take 1 tab PO BID for up to 7 days.) 20 tablet 0 04/12/2024   omeprazole (PRILOSEC) 40 MG capsule Take 40 mg by mouth 2 (two) times daily.   04/12/2024 at  9:15 AM    Current Facility-Administered Medications  Medication Dose Route Frequency Provider Last Rate Last Admin   bupivacaine  liposome (EXPAREL ) 1.3 % injection 266 mg  20 mL Infiltration Once Sheldon Standing, MD       ceFAZolin  (ANCEF ) IVPB 2g/100 mL premix  2 g Intravenous On Call to OR Sheldon Standing, MD       Chlorhexidine  Gluconate Cloth 2 % PADS 6 each  6 each Topical Once Sheldon Standing, MD       And   Chlorhexidine  Gluconate Cloth 2 % PADS 6 each  6 each Topical Once Sheldon Standing, MD       lactated ringers  infusion   Intravenous Continuous Corinne Garnette BRAVO, MD 10 mL/hr at 04/12/24 1105 New Bag at 04/12/24 1105     No Known Allergies  BP 120/78   Pulse 86   Temp 97.6 F (36.4 C) (Oral)   Resp 16   Ht 5' (1.524 m)  Wt 73.5 kg   SpO2 96%   BMI 31.65 kg/m   Labs: No results found for this or any previous visit (from the past 48 hours).  Imaging / Studies: No results found.   Thomas Salinas, M.D., F.A.C.S. Gastrointestinal and Minimally Invasive Surgery [facility] [address] [place], [place] [address] [phone] Main / Paging  04/12/2024 12:14 PM    Thomas Salinas

## 2024-04-12 NOTE — Discharge Instructions (Signed)
 HERNIA REPAIR: POST OP INSTRUCTIONS  ######################################################################  EAT Gradually transition to a high fiber diet with a fiber supplement over the next few weeks after discharge.  Start with a pureed / full liquid diet (see below)  WALK Walk an hour a day.  Control your pain to do that.    CONTROL PAIN Control pain so that you can walk, sleep, tolerate sneezing/coughing, and go up/down stairs.  HAVE A BOWEL MOVEMENT DAILY Keep your bowels regular to avoid problems.  OK to try a laxative to override constipation.  OK to use an antidairrheal to slow down diarrhea.  Call if not better after 2 tries  CALL IF YOU HAVE PROBLEMS/CONCERNS Call if you are still struggling despite following these instructions. Call if you have concerns not answered by these instructions  ######################################################################    DIET: Follow a light bland diet & liquids the first 24 hours after arrival home, such as soup, liquids, starches, etc.  Be sure to drink plenty of fluids.  Quickly advance to a usual solid diet within a few days.  Avoid fast food or heavy meals as your are more likely to get nauseated or have irregular bowels.  A low-fat, high-fiber diet for the rest of your life is ideal.   Take your usually prescribed home medications unless otherwise directed.  PAIN CONTROL: Pain is best controlled by a usual combination of three different methods TOGETHER: Ice/Heat Over the counter pain medication Prescription pain medication Most patients will experience some swelling and bruising around the hernia(s) such as the bellybutton, groins, or old incisions.  Ice packs or heating pads (30-60 minutes up to 6 times a day) will help. Use ice for the first few days to help decrease swelling and bruising, then switch to heat to help relax tight/sore spots and speed recovery.  Some people prefer to use ice alone, heat alone, alternating  between ice & heat.  Experiment to what works for you.  Swelling and bruising can take several weeks to resolve.   It is helpful to take an over-the-counter pain medication regularly for the first few weeks.  Choose one of the following that works best for you: Naproxen (Aleve, etc)  Two 220mg  tabs twice a day Ibuprofen (Advil, etc) Three 200mg  tabs four times a day (every meal & bedtime) Acetaminophen  (Tylenol , etc) 325-650mg  four times a day (every meal & bedtime) A  prescription for pain medication should be given to you upon discharge.  Take your pain medication as prescribed.  If you are having problems/concerns with the prescription medicine (does not control pain, nausea, vomiting, rash, itching, etc), please call us  [phone] to see if we need to switch you to a different pain medicine that will work better for you and/or control your side effect better. If you need a refill on your pain medication, please contact your pharmacy.  They will contact our office to request authorization. Prescriptions will not be filled after 5 pm or on week-ends.  AVOID GETTING CONSTIPATED.   Between the surgery and the pain medications, it is common to experience some constipation.  Drink plenty of liquids Take a fiber supplement 2 times day (such as Metamucil, Citrucel, FiberCon, MiraLax, etc) to have a bowel movement every day. If you have not had a BM by 2 days after surgery: -drink liquids only until you have a bowel movement -take MiraLAX 2 doses every 2 hours until you have a bowel movement   Wash / shower every day.  You may  shower over the dressings as they are waterproof.    It is good for closed incisions and even open wounds to be washed every day.  Shower every day.  Short baths are fine.  Wash the incisions and wounds clean with soap & water.    You may leave closed incisions open to air if it is dry.   You may cover the incision with clean gauze & replace it after your daily shower for  comfort.  TEGADERM & STERISTRIPS:  You have clear gauze band-aid dressings over your closed incision(s).  You also have skin tapes called Steristrips on your incisions.  Leave these in place.  You may trim any edges that curl up with clean scissors.  You may remove all dressings & tapes in the shower in 2 days = 11/21 Friday/ .    ACTIVITIES as tolerated:   You may resume regular (light) daily activities beginning the next day--such as daily self-care, walking, climbing stairs--gradually increasing activities as tolerated.  Control your pain so that you can walk an hour a day.  If you can walk 30 minutes without difficulty, it is safe to try more intense activity such as jogging, treadmill, bicycling, low-impact aerobics, swimming, etc. Save the most intensive and strenuous activity for last such as sit-ups, heavy lifting, contact sports, etc  Refrain from any heavy lifting or straining until you are off narcotics for pain control.   DO NOT PUSH THROUGH PAIN.  Let pain be your guide: If it hurts to do something, don't do it.  Pain is your body warning you to avoid that activity for another week until the pain goes down. You may drive when you are no longer taking prescription pain medication, you can comfortably wear a seatbelt, and you can safely maneuver your car and apply brakes. You may have sexual intercourse when it is comfortable.   FOLLOW UP in our office Please call [facility] at [phone] to set up an appointment to see your surgeon in the office for a follow-up appointment approximately 2-3 weeks after your surgery. Make sure that you call for this appointment the day you arrive home to insure a convenient appointment time.  9.  If you have disability of FMLA / Family leave forms, please bring the forms to the office for processing.  (do not give to your surgeon).  WHEN TO CALL US  [phone]: Poor pain control Reactions / problems with new medications (rash/itching, nausea, etc)   Fever over 101.5 F (38.5 C) Inability to urinate Nausea and/or vomiting Worsening swelling or bruising Continued bleeding from incision. Increased pain, redness, or drainage from the incision   The clinic staff is available to answer your questions during regular business hours (8:30am-5pm).  Please don't hesitate to call and ask to speak to one of our nurses for clinical concerns.   If you have a medical emergency, go to the nearest emergency room or call 911.  A surgeon from [facility] is always on call at the hospitals in [facility] [place] [address], [place], [place]  [address] ?  [address] [address], [place], [place]   [address] MAIN: [id number] ? TOLL FREE: [phone] ? FAX: [phone] [email or web address]

## 2024-04-12 NOTE — Anesthesia Postprocedure Evaluation (Signed)
 Anesthesia Post Note  Patient: Thomas Salinas  Procedure(s) Performed: REPAIR, HERNIA, VENTRAL, ROBOT-ASSISTED (Abdomen) REPAIR, HERNIA, INGUINAL, ROBOT-ASSISTED, LAPAROSCOPIC, USING MESH (Right: Abdomen)     Patient location during evaluation: PACU Anesthesia Type: General Level of consciousness: awake and alert Pain management: pain level controlled Vital Signs Assessment: post-procedure vital signs reviewed and stable Respiratory status: spontaneous breathing, nonlabored ventilation, respiratory function stable and patient connected to nasal cannula oxygen Cardiovascular status: blood pressure returned to baseline and stable Postop Assessment: no apparent nausea or vomiting Anesthetic complications: no   No notable events documented.  Last Vitals:  Vitals:   04/12/24 1845 04/12/24 1910  BP: 91/61 91/71  Pulse: 94 86  Resp: 17 18  Temp:  36.4 C  SpO2: 92% 96%    Last Pain:  Vitals:   04/12/24 1910  TempSrc: Oral  PainSc:                  Bernadetta Roell D Marvetta Vohs

## 2024-04-13 ENCOUNTER — Other Ambulatory Visit: Payer: Self-pay

## 2024-04-13 ENCOUNTER — Encounter (HOSPITAL_COMMUNITY): Payer: Self-pay | Admitting: Surgery

## 2024-04-13 DIAGNOSIS — K430 Incisional hernia with obstruction, without gangrene: Secondary | ICD-10-CM | POA: Diagnosis not present

## 2024-04-13 MED ORDER — OXYCODONE HCL 5 MG PO TABS
5.0000 mg | ORAL_TABLET | ORAL | Status: DC | PRN
  Administered 2024-04-13: 5 mg via ORAL
  Filled 2024-04-13: qty 1

## 2024-04-13 MED ORDER — COLCHICINE 0.6 MG PO TABS
0.6000 mg | ORAL_TABLET | Freq: Two times a day (BID) | ORAL | Status: DC
  Administered 2024-04-13: 0.6 mg via ORAL
  Filled 2024-04-13: qty 1

## 2024-04-13 MED ORDER — HYDROMORPHONE HCL 1 MG/ML IJ SOLN: 0.5000 mg | INTRAMUSCULAR | Status: DC | PRN

## 2024-04-13 MED ORDER — NAPROXEN 250 MG PO TABS: 500.0000 mg | ORAL_TABLET | Freq: Two times a day (BID) | ORAL | Status: DC | PRN

## 2024-04-13 MED ORDER — CALCIUM POLYCARBOPHIL 625 MG PO TABS
625.0000 mg | ORAL_TABLET | Freq: Two times a day (BID) | ORAL | Status: DC
  Administered 2024-04-13: 625 mg via ORAL
  Filled 2024-04-13: qty 1

## 2024-04-13 NOTE — Plan of Care (Signed)

## 2024-04-13 NOTE — Discharge Summary (Addendum)
 Physician Discharge Summary    Thomas Salinas MRN: [id number] DOB/AGE: 1975/08/18 = 47 y.o.  Patient Care Team: Kip Righter, MD as PCP - General (Family Medicine) Sheldon Standing, MD as Consulting Physician (General Surgery) Rubin Calamity, MD as Consulting Physician (General Surgery)  Admit date: 04/12/2024  Discharge date: 04/13/2024  Hospital Stay = 0 days    Discharge Diagnoses:  Principal Problem:   Recurrent incisional hernia with incarceration   1 Day Post-Op  04/12/2024  POST-OPERATIVE DIAGNOSIS:   VENTRAL INCISIONAL RECURRENT ABDOMINAL WALL HERNIAE BILATERAL INGUINAL HERNIAS   PROCEDURE:   ROBOTIC BILATERAL INGUINAL HERNIA REPAIRS WITH MESH  LAPAROSCOPIC REPAIR OF  INCISIONAL RECURRENT ABDOMINAL WALL HERNIA WITH MESH ROBOTIC LYSIS OF ADHESIONS X 150 MIN (2/3 CASE) TRANSVERSUS ABDOMINIS PLANE (TAP) BLOCK - BILATERAL   SURGEON:  Standing KYM Sheldon, MD   OR FINDINGS:   Very dense omental adhesions to old mesh.  Periumbilical incisional hernia with some breakdown centrally as well.  10 x 5 cm region.   Type of ventral wall repair:  Laparoscopic underlay repair .without Primary repair of largest hernia Placement of mesh: Superiorly intraperitoneal with inferior half tucked into lower abdominal and suprapubic RECTRORECTUS space Name of mesh: Bard Ventralight dual sided (polypropylene / Seprafilm) Size of mesh: 25x20cm Orientation: Transverse Mesh overlap:  7cm    Consults: Pharmacy and Anesthesia  Hospital Course:   The patient underwent the surgery above.  Postoperatively, the patient gradually mobilized and advanced to a solid diet.  Pain and other symptoms were treated aggressively.  Patient struggle to urinate so had I&O catheterization.  Able to void after that  By the time of discharge, the patient was walking well the hallways, eating food, having flatus.  Pain was well-controlled on an oral medications.  Based on meeting discharge criteria and  continuing to recover, I felt it was safe for the patient to be discharged from the hospital to further recover with close followup. Postoperative recommendations were discussed in detail.  They are written as well.  Discharged Condition: stable  Discharge Exam: Blood pressure 109/66, pulse 92, temperature 97.7 F (36.5 C), temperature source Oral, resp. rate 18, height 5' (1.524 m), weight 73.5 kg, SpO2 94%.  General: Pt awake/alert/oriented x4 in No acute distress Eyes: PERRL, normal EOM.  Sclera clear.  No icterus Neuro: CN II-XII intact w/o focal sensory/motor deficits. Lymph: No head/neck/groin lymphadenopathy Psych:  No delerium/psychosis/paranoia HENT: Normocephalic, Mucus membranes moist.  No thrush Neck: Supple, No tracheal deviation Chest:  No chest wall pain w good excursion CV:  Pulses intact.  Regular rhythm MS: Normal AROM mjr joints.  No obvious deformity Abdomen: Soft.  Nondistended.  Mildly tender at incisions only.  No evidence of peritonitis.  No incarcerated hernias. Ext:  SCDs BLE.  No mjr edema.  No cyanosis Skin: No petechiae / purpura   Disposition:    Follow-up Information     Sheldon Standing, MD Follow up on 05/09/2024.   Specialties: General Surgery, Colon and Rectal Surgery Why: To follow up after your operation Contact information: [address] [place] [address] [phone]                 Discharge disposition: 01-Home or Self Care       Discharge Instructions     Call MD for:   Complete by: As directed    FEVER >101.5 F (Temperatures <101.53F occasionally happen and are not significant)   Call MD for:  extreme fatigue  Complete by: As directed    Call MD for:  persistant dizziness or light-headedness   Complete by: As directed    Call MD for:  persistant nausea and vomiting   Complete by: As directed    Call MD for:  redness, tenderness, or signs of infection (pain, swelling, redness, odor or green/yellow  discharge around incision site)   Complete by: As directed    Call MD for:  severe uncontrolled pain   Complete by: As directed    Diet - low sodium heart healthy   Complete by: As directed    Follow a light diet the first few days at home.  Start with a bland diet such as soups, liquids, starchy foods, low fat foods, etc.   If you feel full, bloated, or constipated, stay on a full liquid or pureed/blenderized diet for a few days until you feel better and no longer constipated. Gradually get back to a regular solid diet.  Avoid fast food or heavy meals the first week as you are more likely to get nauseated.   Discharge instructions   Complete by: As directed    One the day of your discharge from the hospital (or the next business weekday), please call [facility] to set up or confirm an appointment to see your surgeon in the office for a follow-up appointment.  Usually it is 2-3 weeks after your surgery.  Other concerns If you are not getting better after two weeks or are noticing you are getting worse, contact our office [phone] for further advice.  We may need to adjust your medications, re-evaluate you in the office, send you to the emergency room, or see what other things we can do to help. The clinic staff is available to answer your questions during regular business hours (8:30am-5pm).  Please don't hesitate to call and ask to speak to one of our nurses for clinical concerns.    A surgeon from [facility] is always on call at the hospitals 24 hours/day If you have a medical emergency, go to the nearest emergency room or call 911.   Discharge wound care:   Complete by: As directed    It is good for closed incisions and even open wounds to be washed every day.  Shower every day.  Short baths are fine.  Wash the incisions and wounds clean with soap & water.    You may leave closed incisions open to air if it is dry.   You may cover the incision with clean  gauze & replace it after your daily shower for comfort.  TEGADERM & STERISTRIPS:  You have clear gauze band-aid dressings over your closed incision(s).  You also have skin tapes called Steristrips on your incisions.  Leave these in place.  Remove Friday 11/21, two days after surgery  Leave incisions open to air.  OK to cover incisions with gauze or bandages as desired   Driving Restrictions   Complete by: As directed    You may drive when you are no longer taking prescription pain medication, you can comfortably wear a seatbelt, and you can safely maneuver your car and apply brakes.   Increase activity slowly   Complete by: As directed    Lifting restrictions   Complete by: As directed    You may resume regular (light) daily activities beginning the next day-such as daily self-care, walking, climbing stairs-gradually increasing activities as tolerated.   If you can walk 30 minutes without difficulty,  it is safe to try more intense activity such as jogging, treadmill, bicycling, low-impact aerobics, swimming, etc. Save the most intensive and strenuous activity for last such as sit-ups, heavy lifting, contact sports, etc   Refrain from any heavy lifting or straining until you are off narcotics for pain control.   DO NOT PUSH THROUGH PAIN.   Let pain be your guide: If it hurts to do something, don't do it.   Pain is your body warning you to avoid that activity for another week until the pain goes down.   May shower / Bathe   Complete by: As directed    Wash / shower every day.  You may shower over the dressings as they are waterproof.  Continue to shower over incision(s) after the dressing is off.   May walk up steps   Complete by: As directed    Remove dressing in 48 hours   Complete by: As directed    Make sure all dressings have been removed on the second day after surgery = 11/21 Friday Leave incisions open to air.  OK to cover incisions with gauze or bandages as desired   Sexual  Activity Restrictions   Complete by: As directed    You may have sexual intercourse when it is comfortable. If it hurts to do something, stop.       Allergies as of 04/13/2024   No Known Allergies      Medication List     TAKE these medications    allopurinol  100 MG tablet Commonly known as: ZYLOPRIM  TAKE ONE TABLET BY MOUTH DAILY   colchicine  0.6 MG tablet Today: Take 2 tablet by mouth once. Take an additional tablet 1 hour later. Day 2 on: Take 1 tab PO BID for up to 7 days. What changed:  how much to take how to take this when to take this reasons to take this   cyclobenzaprine  5 MG tablet Commonly known as: FLEXERIL  Take 1 tablet (5 mg total) by mouth 3 (three) times daily as needed for muscle spasms.   omeprazole 40 MG capsule Commonly known as: PRILOSEC Take 40 mg by mouth 2 (two) times daily.   oxyCODONE -acetaminophen  5-325 MG tablet Commonly known as: Percocet Take 1 tablet by mouth every 6 (six) hours as needed for severe pain (pain score 7-10). Can increase to 2 pills at a time if needed               Discharge Care Instructions  (From admission, onward)           Start     Ordered   04/12/24 0000  Discharge wound care:       Comments: It is good for closed incisions and even open wounds to be washed every day.  Shower every day.  Short baths are fine.  Wash the incisions and wounds clean with soap & water.    You may leave closed incisions open to air if it is dry.   You may cover the incision with clean gauze & replace it after your daily shower for comfort.  TEGADERM & STERISTRIPS:  You have clear gauze band-aid dressings over your closed incision(s).  You also have skin tapes called Steristrips on your incisions.  Leave these in place.  Remove Friday 11/21, two days after surgery  Leave incisions open to air.  OK to cover incisions with gauze or bandages as desired   04/12/24 1237  Significant Diagnostic Studies:  No  results found for this or any previous visit (from the past 72 hours).  No results found.  Past Medical History:  Diagnosis Date   Arthritis    Gout   Down's syndrome    Fatty liver    GERD (gastroesophageal reflux disease)    History of repair of congenital atrial septal defect (ASD) 1983   and Mitral valve repaired at the same time,  [place]    History of strabismus surgery    Pre-diabetes     Past Surgical History:  Procedure Laterality Date   CARDIAC SURGERY  1983   ASD repair, mitral valve replacement   CHOLECYSTECTOMY N/A 09/12/2012   Dr Rubin   EYE SURGERY  05/26/1979   strabismus   LAPAROSCOPIC ASSISTED VENTRAL HERNIA REPAIR  03/28/2010   lap VWH w 25x20cm mesh supraumb   MYRINGOTOMY  05/26/1987   left ear  with tympanostomy   TONSILLECTOMY AND ADENOIDECTOMY      Social History   Socioeconomic History   Marital status: Single    Spouse name: Not on file   Number of children: Not on file   Years of education: Not on file   Highest education level: Not on file  Occupational History   Not on file  Tobacco Use   Smoking status: Never   Smokeless tobacco: Never  Vaping Use   Vaping status: Never Used  Substance and Sexual Activity   Alcohol use: No   Drug use: No   Sexual activity: Never  Other Topics Concern   Not on file  Social History Narrative   Not on file   Social Drivers of Health   Financial Resource Strain: Low Risk  (07/15/2023)   Received from [facility]   Overall Financial Resource Strain (CARDIA)    Difficulty of Paying Living Expenses: Not very hard  Food Insecurity: No Food Insecurity (07/15/2023)   Received from [facility]   Hunger Vital Sign    Within the past 12 months, you worried that your food would run out before you got the money to buy more.: Never true    Within the past 12 months, the food you bought just didn't last and you didn't have money to get more.: Never true  Transportation Needs: No Transportation Needs  (07/15/2023)   Received from [facility] - Transportation    Lack of Transportation (Medical): No    Lack of Transportation (Non-Medical): No  Physical Activity: Insufficiently Active (07/15/2023)   Received from [facility]   Exercise Vital Sign    On average, how many days per week do you engage in moderate to strenuous exercise (like a brisk walk)?: 2 days    On average, how many minutes do you engage in exercise at this level?: 60 min  Stress: No Stress Concern Present (07/15/2023)   Received from [facility] - Occupational Stress Questionnaire    Feeling of Stress : Only a little  Social Connections: Moderately Integrated (07/15/2023)   Received from [facility]   Social Network    How would you rate your social network (family, work, friends)?: Adequate participation with social networks  Intimate Partner Violence: Not At Risk (07/15/2023)   Received from [facility]   HITS    Over the last 12 months how often did your partner physically hurt you?: Never    Over the last 12 months how often did your partner insult you or  talk down to you?: Never    Over the last 12 months how often did your partner threaten you with physical harm?: Never    Over the last 12 months how often did your partner scream or curse at you?: Never    Family History  Problem Relation Age of Onset   Hyperlipidemia Mother    Hypertension Mother    Hyperlipidemia Father    Heart disease Father    Hearing loss Father    Hypertension Brother    Hearing loss Brother    Asthma Brother    Hearing loss Brother     Current Facility-Administered Medications  Medication Dose Route Frequency Provider Last Rate Last Admin   0.9 %  sodium chloride  infusion  250 mL Intravenous PRN Sheldon Standing, MD       acetaminophen  (TYLENOL ) tablet 1,000 mg  1,000 mg Oral Q6H Starleen Trussell, MD   1,000 mg at 04/13/24 0539   allopurinol  (ZYLOPRIM ) tablet 100 mg  100 mg  Oral Daily Sheldon Standing, MD       alum & mag hydroxide-simeth (MAALOX/MYLANTA) 200-200-20 MG/5ML suspension 30 mL  30 mL Oral Q6H PRN Sheldon Standing, MD       bisacodyl (DULCOLAX) suppository 10 mg  10 mg Rectal Daily PRN Sheldon Standing, MD       colchicine  tablet 0.6-1.2 mg  0.6-1.2 mg Oral BID PRN Sheldon Standing, MD       cyclobenzaprine (FLEXERIL) tablet 5-10 mg  5-10 mg Oral TID PRN Sheldon Standing, MD       diphenhydrAMINE (BENADRYL) 12.5 MG/5ML elixir 12.5 mg  12.5 mg Oral Q6H PRN Sheldon Standing, MD       Or   diphenhydrAMINE (BENADRYL) injection 12.5 mg  12.5 mg Intravenous Q6H PRN Sheldon Standing, MD       enoxaparin  (LOVENOX ) injection 40 mg  40 mg Subcutaneous Q24H Sheldon Standing, MD       fentaNYL  (SUBLIMAZE ) injection 25-50 mcg  25-50 mcg Intravenous Q2H PRN Sheldon Standing, MD       gabapentin (NEURONTIN) capsule 300 mg  300 mg Oral BID Sheldon Standing, MD   300 mg at 04/12/24 2110   HYDROmorphone  (DILAUDID ) injection 0.5-2 mg  0.5-2 mg Intravenous Q4H PRN Sheldon Standing, MD   1 mg at 04/13/24 0601   lactated ringers  bolus 1,000 mL  1,000 mL Intravenous Q8H PRN Sheldon Standing, MD       lactated ringers  infusion   Intravenous Q8H PRN Sheldon Standing, MD       magic mouthwash  15 mL Oral QID PRN Sheldon Standing, MD       magnesium hydroxide (MILK OF MAGNESIA) suspension 30 mL  30 mL Oral Daily PRN Sheldon Standing, MD       menthol (CEPACOL) lozenge 3 mg  1 lozenge Oral PRN Sheldon Standing, MD       methocarbamol (ROBAXIN) tablet 500 mg  500 mg Oral Q6H PRN Sheldon Standing, MD       metoprolol tartrate (LOPRESSOR) injection 5 mg  5 mg Intravenous Q6H PRN Sheldon Standing, MD       naphazoline-glycerin (CLEAR EYES REDNESS) ophth solution 1-2 drop  1-2 drop Both Eyes QID PRN Sheldon Standing, MD       naproxen (NAPROSYN) tablet 500 mg  500 mg Oral Q12H PRN Sheldon Standing, MD       ondansetron  (ZOFRAN -ODT) disintegrating tablet 4 mg  4 mg Oral Q6H PRN Sheldon Standing, MD       Or  ondansetron  (ZOFRAN ) injection 4 mg   4 mg Intravenous Q6H PRN Sheldon Standing, MD       oxyCODONE  (Oxy IR/ROXICODONE ) immediate release tablet 5-10 mg  5-10 mg Oral Q4H PRN Sheldon Standing, MD       pantoprazole (PROTONIX) EC tablet 80 mg  80 mg Oral Daily Sheldon Standing, MD       phenol (CHLORASEPTIC) mouth spray 2 spray  2 spray Mouth/Throat PRN Sheldon Standing, MD       polycarbophil (FIBERCON) tablet 625 mg  625 mg Oral BID Sheldon Standing, MD       prochlorperazine (COMPAZINE) tablet 10 mg  10 mg Oral Q6H PRN Sheldon Standing, MD       Or   prochlorperazine (COMPAZINE) injection 5-10 mg  5-10 mg Intravenous Q6H PRN Sheldon Standing, MD       simethicone (MYLICON) 40 MG/0.6ML suspension 80 mg  80 mg Oral QID PRN Sheldon Standing, MD       simethicone (MYLICON) chewable tablet 40 mg  40 mg Oral Q6H PRN Sheldon Standing, MD       sodium chloride  (OCEAN) 0.65 % nasal spray 1-2 spray  1-2 spray Each Nare Q6H PRN Sheldon Standing, MD       sodium chloride  flush (NS) 0.9 % injection 3 mL  3 mL Intravenous Q12H Nohelia Valenza, MD       sodium chloride  flush (NS) 0.9 % injection 3 mL  3 mL Intravenous PRN Sheldon Standing, MD       traMADol DANNY) tablet 50-100 mg  50-100 mg Oral Q6H PRN Sheldon Standing, MD         No Known Allergies  Signed:   Standing KYM Sheldon, MD, FACS, MASCRS Esophageal, Gastrointestinal & Colorectal Surgery Robotic and Minimally Invasive Surgery  [facility] [address] [place], [place] [address] [phone] Fax [phone] Main  CONTACT INFORMATION: Weekday (9AM-5PM): Call [facility] main office at [phone] Weeknight (5PM-9AM) or Weekend/Holiday: Check EPIC Web Links tab & use AMION (password  [email or web address]) for General Surgery [facility] coverage  Please, DO NOT use SecureChat  (it is not reliable communication to reach operating surgeons & will lead to a delay in care).   Epic staff messaging available for outpatient concerns needing 1-2 business day response.       04/13/2024, 7:31 AM

## 2024-04-13 NOTE — Progress Notes (Signed)
   04/13/24 1138  TOC Brief Assessment  Insurance and Status Reviewed  Patient has primary care physician Yes  Home environment has been reviewed resides in a private residence  Prior level of function: Independent  Prior/Current Home Services No current home services  Social Drivers of Health Review SDOH reviewed no interventions necessary  Readmission risk has been reviewed Yes  Transition of care needs no transition of care needs at this time
